# Patient Record
Sex: Male | Born: 1945
Health system: Southern US, Community
[De-identification: ages and names within clinical notes are randomized; demographics above are authoritative.]

## PROBLEM LIST (undated history)

## (undated) DIAGNOSIS — Z87442 Personal history of urinary calculi: Secondary | ICD-10-CM

## (undated) DIAGNOSIS — E785 Hyperlipidemia, unspecified: Secondary | ICD-10-CM

## (undated) DIAGNOSIS — N281 Cyst of kidney, acquired: Secondary | ICD-10-CM

## (undated) DIAGNOSIS — J439 Emphysema, unspecified: Secondary | ICD-10-CM

## (undated) DIAGNOSIS — Z8619 Personal history of other infectious and parasitic diseases: Secondary | ICD-10-CM

## (undated) DIAGNOSIS — M1990 Unspecified osteoarthritis, unspecified site: Secondary | ICD-10-CM

## (undated) DIAGNOSIS — K760 Fatty (change of) liver, not elsewhere classified: Secondary | ICD-10-CM

## (undated) DIAGNOSIS — F17200 Nicotine dependence, unspecified, uncomplicated: Secondary | ICD-10-CM

## (undated) DIAGNOSIS — I10 Essential (primary) hypertension: Secondary | ICD-10-CM

## (undated) DIAGNOSIS — I2510 Atherosclerotic heart disease of native coronary artery without angina pectoris: Secondary | ICD-10-CM

## (undated) DIAGNOSIS — I714 Abdominal aortic aneurysm, without rupture: Principal | ICD-10-CM

## (undated) DIAGNOSIS — K7689 Other specified diseases of liver: Secondary | ICD-10-CM

## (undated) HISTORY — DX: Abdominal aortic aneurysm, without rupture: I71.4

## (undated) HISTORY — DX: Cyst of kidney, acquired: N28.1

## (undated) HISTORY — DX: Personal history of other infectious and parasitic diseases: Z86.19

## (undated) HISTORY — DX: Atherosclerotic heart disease of native coronary artery without angina pectoris: I25.10

## (undated) HISTORY — DX: Emphysema, unspecified: J43.9

## (undated) HISTORY — DX: Other specified diseases of liver: K76.89

## (undated) HISTORY — DX: Unspecified osteoarthritis, unspecified site: M19.90

## (undated) HISTORY — PX: ANKLE SURGERY: SHX546

## (undated) HISTORY — DX: Personal history of urinary calculi: Z87.442

## (undated) HISTORY — DX: Fatty (change of) liver, not elsewhere classified: K76.0

## (undated) HISTORY — DX: Nicotine dependence, unspecified, uncomplicated: F17.200

---

## 1964-08-28 DIAGNOSIS — Z87442 Personal history of urinary calculi: Secondary | ICD-10-CM

## 1964-08-28 HISTORY — DX: Personal history of urinary calculi: Z87.442

## 1998-08-28 HISTORY — PX: CHOLECYSTECTOMY: SHX55

## 1999-04-15 ENCOUNTER — Ambulatory Visit (HOSPITAL_COMMUNITY): Admission: RE | Admit: 1999-04-15 | Discharge: 1999-04-15 | Payer: Self-pay | Admitting: *Deleted

## 2014-08-07 ENCOUNTER — Encounter: Payer: Self-pay | Admitting: Family Medicine

## 2014-08-07 ENCOUNTER — Encounter (INDEPENDENT_AMBULATORY_CARE_PROVIDER_SITE_OTHER): Payer: Self-pay

## 2014-08-07 ENCOUNTER — Ambulatory Visit (INDEPENDENT_AMBULATORY_CARE_PROVIDER_SITE_OTHER): Payer: 59 | Admitting: Family Medicine

## 2014-08-07 VITALS — BP 140/90 | HR 84 | Temp 98.2°F | Ht 68.0 in | Wt 172.2 lb

## 2014-08-07 DIAGNOSIS — F17200 Nicotine dependence, unspecified, uncomplicated: Secondary | ICD-10-CM

## 2014-08-07 DIAGNOSIS — I10 Essential (primary) hypertension: Secondary | ICD-10-CM | POA: Insufficient documentation

## 2014-08-07 DIAGNOSIS — Z720 Tobacco use: Secondary | ICD-10-CM

## 2014-08-07 DIAGNOSIS — R030 Elevated blood-pressure reading, without diagnosis of hypertension: Secondary | ICD-10-CM

## 2014-08-07 DIAGNOSIS — Z87891 Personal history of nicotine dependence: Secondary | ICD-10-CM | POA: Insufficient documentation

## 2014-08-07 DIAGNOSIS — Z23 Encounter for immunization: Secondary | ICD-10-CM

## 2014-08-07 NOTE — Progress Notes (Signed)
Pre visit review using our clinic review tool, if applicable. No additional management support is needed unless otherwise documented below in the visit note. 

## 2014-08-07 NOTE — Addendum Note (Signed)
Addended by: Royann Shivers A on: 08/07/2014 03:24 PM   Modules accepted: Orders

## 2014-08-07 NOTE — Patient Instructions (Addendum)
prevnar today. Call your insurance about the shingles shot to see if it is covered or how much it would cost and where is cheaper (here or pharmacy).  If you want to receive here, call for nurse visit.  Your blood pressure is looking ok today - I think this was the ham. Watch salt and sodium in diet. Increase fruits/vegetables and water.  Keep an eye on blood pressure at home once a week or once every 2 weeks and let me know if persistently >140/90. Return at your convenience over next 3-4 months for wellness visit

## 2014-08-07 NOTE — Progress Notes (Signed)
   BP 140/90 mmHg  Pulse 84  Temp(Src) 98.2 F (36.8 C) (Oral)  Ht 5\' 8"  (1.727 m)  Wt 172 lb 4 oz (78.132 kg)  BMI 26.20 kg/m2   CC: new pt to establish  Subjective:    Patient ID: Gregory Colon, male    DOB: 01-07-1946, 68 y.o.   MRN: [id number]  HPI: Gregory Colon is a 68 y.o. male presenting on 08/07/2014 for Establish Care   No PCP in 15 yrs (no doc in 15 yrs).  Father of Gregory Colon. She was worried about his blood pressure. At home it had been 190/100s. Had increased country ham intake over several days when bp checked. Drinks some water but mainly coffee 4-5 cups/day. Denies palpitations or skipped heart beats. Denies HA, vision changes, CP/tightness, SOB, leg swelling.  Smoker - 1 ppd chronically. Precontemplative.  Preventative: Flu at [facility] today. zostavax - will check with insurance.  Lives alone, divorced, father of Gregory Colon Occupation: retired, was [employer] Activity: yardwork - cutting wood Diet: some water, fruits/vegetables some  Relevant past medical, surgical, family and social history reviewed and updated as indicated. Interim medical history since our last visit reviewed. Allergies and medications reviewed and updated.  No current outpatient prescriptions on file prior to visit.   No current facility-administered medications on file prior to visit.    Review of Systems Per HPI unless specifically indicated above     Objective:    BP 140/90 mmHg  Pulse 84  Temp(Src) 98.2 F (36.8 C) (Oral)  Ht 5\' 8"  (1.727 m)  Wt 172 lb 4 oz (78.132 kg)  BMI 26.20 kg/m2  Wt Readings from Last 3 Encounters:  08/07/14 172 lb 4 oz (78.132 kg)    Physical Exam  Constitutional: He appears well-developed and well-nourished. No distress.  HENT:  Mouth/Throat: Oropharynx is clear and moist. No oropharyngeal exudate.  Eyes: Conjunctivae and EOM are normal. Pupils are equal, round, and reactive to light. No scleral icterus.    Neck: Normal range of motion. Neck supple.  Cardiovascular: Normal rate, regular rhythm, normal heart sounds and intact distal pulses.   No murmur heard. Pulmonary/Chest: Effort normal and breath sounds normal. No respiratory distress. He has no wheezes. He has no rales.  Lungs clear on exam today.  Musculoskeletal: He exhibits no edema.  Skin: Skin is warm and dry. No rash noted.  Psychiatric: He has a normal mood and affect.  Nursing note and vitals reviewed.  No results found for this or any previous visit.    Assessment & Plan:   Problem List Items Addressed This Visit    Smoker    Encourage cessation precontemplative.    Elevated blood pressure reading without diagnosis of hypertension - Primary    Anticipate diet related - discussed this in detail as well as dietary and lifestyle choices to improve readings. Pt reliable, can monitor blood pressures at daughter's house (neighbors) and will notify me if persistently >140/90.        Follow up plan: Return in about 3 months (around 11/06/2014), or as needed, for annual exam, prior fasting for blood work.

## 2014-08-07 NOTE — Assessment & Plan Note (Signed)
Encourage cessation. precontemplative. 

## 2014-08-07 NOTE — Assessment & Plan Note (Signed)
Anticipate diet related - discussed this in detail as well as dietary and lifestyle choices to improve readings. Pt reliable, can monitor blood pressures at daughter's house (neighbors) and will notify me if persistently >140/90.

## 2014-08-10 ENCOUNTER — Telehealth: Payer: Self-pay | Admitting: Family Medicine

## 2014-08-10 NOTE — Telephone Encounter (Signed)
emmi mailed  °

## 2014-08-25 ENCOUNTER — Emergency Department: Payer: Self-pay | Admitting: Emergency Medicine

## 2014-08-25 LAB — COMPREHENSIVE METABOLIC PANEL
ALBUMIN: 3.9 g/dL (ref 3.4–5.0)
ANION GAP: 6 — AB (ref 7–16)
Alkaline Phosphatase: 76 U/L
BUN: 17 mg/dL (ref 7–18)
Bilirubin,Total: 0.2 mg/dL (ref 0.2–1.0)
Calcium, Total: 9.1 mg/dL (ref 8.5–10.1)
Chloride: 105 mmol/L (ref 98–107)
Co2: 25 mmol/L (ref 21–32)
Creatinine: 0.98 mg/dL (ref 0.60–1.30)
Glucose: 99 mg/dL (ref 65–99)
Osmolality: 274 (ref 275–301)
Potassium: 4.3 mmol/L (ref 3.5–5.1)
SGOT(AST): 17 U/L (ref 15–37)
SGPT (ALT): 25 U/L
Sodium: 136 mmol/L (ref 136–145)
Total Protein: 7.4 g/dL (ref 6.4–8.2)

## 2014-08-25 LAB — BASIC METABOLIC PANEL
Creatinine: 1 mg/dL (ref <=1.3)
GLUCOSE: 99 mg/dL
POTASSIUM: 4.3 mmol/L (ref 3.4–5.3)
SODIUM: 136 mmol/L — AB (ref 137–147)

## 2014-08-25 LAB — HEPATIC FUNCTION PANEL
ALT: 25 U/L (ref 10–40)
AST: 17 U/L (ref 14–40)
Alkaline Phosphatase: 76 U/L (ref 25–125)
Bilirubin, Total: 0.2 mg/dL

## 2014-08-25 LAB — CBC WITH DIFFERENTIAL/PLATELET
BASOS ABS: 0.1 10*3/uL (ref 0.0–0.1)
BASOS PCT: 1.1 %
Eosinophil #: 0.3 10*3/uL (ref 0.0–0.7)
Eosinophil %: 2.9 %
HCT: 47.3 % (ref 40.0–52.0)
HGB: 15.6 g/dL (ref 13.0–18.0)
Lymphocyte #: 3.2 10*3/uL (ref 1.0–3.6)
Lymphocyte %: 29.1 %
MCH: 30.3 pg (ref 26.0–34.0)
MCHC: 33 g/dL (ref 32.0–36.0)
MCV: 92 fL (ref 80–100)
MONO ABS: 1 x10 3/mm (ref 0.2–1.0)
Monocyte %: 9.7 %
NEUTROS ABS: 6.2 10*3/uL (ref 1.4–6.5)
Neutrophil %: 57.2 %
PLATELETS: 293 10*3/uL (ref 150–440)
RBC: 5.14 10*6/uL (ref 4.40–5.90)
RDW: 13.8 % (ref 11.5–14.5)
WBC: 10.9 10*3/uL — ABNORMAL HIGH (ref 3.8–10.6)

## 2014-08-25 LAB — TROPONIN I: Troponin-I: <0.02 ng/mL

## 2014-08-25 LAB — URINALYSIS, COMPLETE
BACTERIA: NONE SEEN
Bilirubin,UR: NEGATIVE
Glucose,UR: NEGATIVE mg/dL (ref 0–75)
KETONE: NEGATIVE
LEUKOCYTE ESTERASE: NEGATIVE
Nitrite: NEGATIVE
PH: 5 (ref 4.5–8.0)
Protein: NEGATIVE
RBC,UR: <1 /HPF (ref 0–5)
Specific Gravity: 1.005 (ref 1.003–1.030)
Squamous Epithelial: NONE SEEN

## 2014-08-25 LAB — CBC AND DIFFERENTIAL
Hemoglobin: 15.6 g/dL (ref 13.5–17.5)
PLATELETS: 293 10*3/uL (ref 150–399)
WBC: 10.9 10^3/mL

## 2014-08-26 ENCOUNTER — Telehealth: Payer: Self-pay | Admitting: Family Medicine

## 2014-08-26 MED ORDER — AMLODIPINE BESYLATE 5 MG PO TABS: 5.0000 mg | ORAL_TABLET | Freq: Every day | ORAL | Status: DC

## 2014-08-26 NOTE — Telephone Encounter (Signed)
received message from daughter - bp has been ranging 160/90s. plz call patient - I'd like him to start amlodipine 5mg  daily and have sent to pharmacy. Keep f/u appt in 3 months, sooner if bp remaining consistently >150/90.

## 2014-08-26 NOTE — Telephone Encounter (Signed)
Spoke with patient and he said he actually went to [facility] last night due to BP. They sent in Rx for him but he wasn't at home and couldn't remember the name. He said he is feeling MUCH better. Follow up scheduled for next week.

## 2014-08-29 NOTE — Telephone Encounter (Signed)
Reviewed ER records from [facility] --> Started on hctz, to f/u with me EKG WNL TnI <0.02

## 2014-09-03 ENCOUNTER — Ambulatory Visit (INDEPENDENT_AMBULATORY_CARE_PROVIDER_SITE_OTHER): Payer: Medicare Other | Admitting: Family Medicine

## 2014-09-03 ENCOUNTER — Encounter: Payer: Self-pay | Admitting: Family Medicine

## 2014-09-03 VITALS — BP 138/84 | HR 80 | Temp 97.7°F | Wt 168.2 lb

## 2014-09-03 DIAGNOSIS — I10 Essential (primary) hypertension: Secondary | ICD-10-CM

## 2014-09-03 DIAGNOSIS — Z720 Tobacco use: Secondary | ICD-10-CM

## 2014-09-03 DIAGNOSIS — F17200 Nicotine dependence, unspecified, uncomplicated: Secondary | ICD-10-CM

## 2014-09-03 LAB — BASIC METABOLIC PANEL
BUN: 16 mg/dL (ref 6–23)
CALCIUM: 9.8 mg/dL (ref 8.4–10.5)
CO2: 30 mEq/L (ref 19–32)
CREATININE: 1.2 mg/dL (ref 0.4–1.5)
Chloride: 97 mEq/L (ref 96–112)
GFR: 66.96 mL/min (ref >60.00)
GLUCOSE: 115 mg/dL — AB (ref 70–99)
Potassium: 4 mEq/L (ref 3.5–5.1)
Sodium: 136 mEq/L (ref 135–145)

## 2014-09-03 MED ORDER — HYDROCHLOROTHIAZIDE 25 MG PO TABS: 25.0000 mg | ORAL_TABLET | Freq: Every day | ORAL | Status: DC

## 2014-09-03 NOTE — Assessment & Plan Note (Signed)
Continue to encourage cessation. In action phase - has increased hard candy use.

## 2014-09-03 NOTE — Progress Notes (Signed)
   BP 138/84 mmHg  Pulse 80  Temp(Src) 97.7 F (36.5 C) (Oral)  Wt 168 lb 4 oz (76.318 kg)   CC: ER f/u  Subjective:    Patient ID: Gregory Colon, male    DOB: 01-29-1946, 69 y.o.   MRN: [id number]  HPI: Gregory Colon is a 69 y.o. male presenting on 09/03/2014 for Follow-up   Recent eval at [facility] ER  --> Started on hctz, rec f/u with me EKG WNL TnI <0.02  Increased fruit intake. On hctz 25mg  daily and tolerating well.  BMP today.  Denies HA, vision changes, CP/tightness, SOB, leg swelling.   Smoking - working on cutting down.   Relevant past medical, surgical, family and social history reviewed and updated as indicated. Interim medical history since our last visit reviewed. Allergies and medications reviewed and updated. Current Outpatient Prescriptions on File Prior to Visit  Medication Sig  . Multiple Vitamins-Minerals (MULTIVITAMIN ADULTS 50+ PO) Take 1 tablet by mouth daily.   No current facility-administered medications on file prior to visit.    Review of Systems Per HPI unless specifically indicated above     Objective:    BP 138/84 mmHg  Pulse 80  Temp(Src) 97.7 F (36.5 C) (Oral)  Wt 168 lb 4 oz (76.318 kg)  Wt Readings from Last 3 Encounters:  09/03/14 168 lb 4 oz (76.318 kg)  08/07/14 172 lb 4 oz (78.132 kg)    Physical Exam  Constitutional: He appears well-developed and well-nourished. No distress.  HENT:  Mouth/Throat: Oropharynx is clear and moist. No oropharyngeal exudate.  Eyes: Conjunctivae and EOM are normal. Pupils are equal, round, and reactive to light. No scleral icterus.  Neck: Normal range of motion. Neck supple. No thyromegaly present.  Cardiovascular: Normal rate, regular rhythm, normal heart sounds and intact distal pulses.   No murmur heard. Pulmonary/Chest: Effort normal and breath sounds normal. No respiratory distress. He has no wheezes. He has no rales.  Musculoskeletal: He exhibits no edema.  Lymphadenopathy:    He  has no cervical adenopathy.  Psychiatric: He has a normal mood and affect.  Nursing note and vitals reviewed.  Results for orders placed or performed in visit on 08/26/14  CBC and differential  Result Value Ref Range   Hemoglobin 15.6 13.5 - 17.5 g/dL   Platelets 293 150 - 399 K/L   WBC 10.9 70^9/GG  Basic metabolic panel  Result Value Ref Range   Glucose 99 mg/dL   Creatinine 1.0 .6 - 1.3 mg/dL   Potassium 4.3 3.4 - 5.3 mmol/L   Sodium 136 (A) 137 - 147 mmol/L  Hepatic function panel  Result Value Ref Range   Alkaline Phosphatase 76 25 - 125 U/L   ALT 25 10 - 40 U/L   AST 17 14 - 40 U/L   Bilirubin, Total 0.2 mg/dL      Assessment & Plan:   Problem List Items Addressed This Visit    Smoker    Continue to encourage cessation. In action phase - has increased hard candy use.    Benign essential HTN - Primary    Tolerating hctz well. Recent EKG at ER stable. Check BMP today for K and Cr on HCTZ. Continue med. F/u in 2 mo for CPE/AMW.    Relevant Medications      hydrochlorothiazide tablet   Other Relevant Orders      Basic metabolic panel       Follow up plan: Return as needed.

## 2014-09-03 NOTE — Progress Notes (Signed)
Pre visit review using our clinic review tool, if applicable. No additional management support is needed unless otherwise documented below in the visit note. 

## 2014-09-03 NOTE — Patient Instructions (Addendum)
I'm gland HCTZ (hydrochlorothiazide) is working well. Let's continue this and you don't need to take amlodipine. Blood work today. Continue working towards quitting smoking. Keep march physical.

## 2014-09-03 NOTE — Assessment & Plan Note (Signed)
Tolerating hctz well. Recent EKG at ER stable. Check BMP today for K and Cr on HCTZ. Continue med. F/u in 2 mo for CPE/AMW.

## 2014-09-04 ENCOUNTER — Telehealth: Payer: Self-pay | Admitting: Family Medicine

## 2014-09-04 ENCOUNTER — Encounter: Payer: Self-pay | Admitting: Family Medicine

## 2014-09-04 NOTE — Telephone Encounter (Signed)
emmi mailed  °

## 2014-09-07 ENCOUNTER — Encounter: Payer: Self-pay | Admitting: *Deleted

## 2014-09-24 ENCOUNTER — Telehealth: Payer: Self-pay

## 2014-09-24 ENCOUNTER — Other Ambulatory Visit: Payer: Self-pay | Admitting: *Deleted

## 2014-09-24 MED ORDER — HYDROCHLOROTHIAZIDE 25 MG PO TABS: 25.0000 mg | ORAL_TABLET | Freq: Every day | ORAL | Status: DC

## 2014-09-24 NOTE — Telephone Encounter (Signed)
Cristy pts daughter wanted to ck refill status of HCTZ; pt was told had no refills available. Spoke with Mendel Ryder at [facility] and rx ready for pick up. Cristy voiced understanding.

## 2014-10-27 DIAGNOSIS — J439 Emphysema, unspecified: Secondary | ICD-10-CM

## 2014-10-27 DIAGNOSIS — I2510 Atherosclerotic heart disease of native coronary artery without angina pectoris: Secondary | ICD-10-CM

## 2014-10-27 HISTORY — DX: Atherosclerotic heart disease of native coronary artery without angina pectoris: I25.10

## 2014-10-27 HISTORY — DX: Emphysema, unspecified: J43.9

## 2014-10-31 ENCOUNTER — Other Ambulatory Visit: Payer: Self-pay | Admitting: Family Medicine

## 2014-10-31 DIAGNOSIS — I10 Essential (primary) hypertension: Secondary | ICD-10-CM

## 2014-10-31 DIAGNOSIS — Z125 Encounter for screening for malignant neoplasm of prostate: Secondary | ICD-10-CM

## 2014-11-04 ENCOUNTER — Other Ambulatory Visit (INDEPENDENT_AMBULATORY_CARE_PROVIDER_SITE_OTHER): Payer: Medicare Other

## 2014-11-04 DIAGNOSIS — Z125 Encounter for screening for malignant neoplasm of prostate: Secondary | ICD-10-CM | POA: Diagnosis not present

## 2014-11-04 DIAGNOSIS — I10 Essential (primary) hypertension: Secondary | ICD-10-CM

## 2014-11-04 LAB — BASIC METABOLIC PANEL
BUN: 16 mg/dL (ref 6–23)
CALCIUM: 10 mg/dL (ref 8.4–10.5)
CO2: 32 meq/L (ref 19–32)
CREATININE: 1 mg/dL (ref 0.40–1.50)
Chloride: 102 mEq/L (ref 96–112)
GFR: 78.64 mL/min (ref >60.00)
GLUCOSE: 99 mg/dL (ref 70–99)
POTASSIUM: 4.8 meq/L (ref 3.5–5.1)
SODIUM: 140 meq/L (ref 135–145)

## 2014-11-04 LAB — LIPID PANEL
Cholesterol: 208 mg/dL — ABNORMAL HIGH (ref 0–200)
HDL: 39.9 mg/dL (ref >39.00)
LDL CALC: 144 mg/dL — AB (ref 0–99)
NONHDL: 168.1
TRIGLYCERIDES: 123 mg/dL (ref 0.0–149.0)
Total CHOL/HDL Ratio: 5
VLDL: 24.6 mg/dL (ref 0.0–40.0)

## 2014-11-04 LAB — PSA, MEDICARE: PSA: 1.02 ng/mL (ref 0.10–4.00)

## 2014-11-11 ENCOUNTER — Encounter: Payer: Self-pay | Admitting: Family Medicine

## 2014-11-11 ENCOUNTER — Ambulatory Visit (INDEPENDENT_AMBULATORY_CARE_PROVIDER_SITE_OTHER): Payer: Medicare Other | Admitting: Family Medicine

## 2014-11-11 VITALS — BP 138/88 | HR 72 | Temp 97.3°F | Ht 68.0 in | Wt 172.5 lb

## 2014-11-11 DIAGNOSIS — Z0000 Encounter for general adult medical examination without abnormal findings: Secondary | ICD-10-CM | POA: Diagnosis not present

## 2014-11-11 DIAGNOSIS — Z1211 Encounter for screening for malignant neoplasm of colon: Secondary | ICD-10-CM

## 2014-11-11 DIAGNOSIS — F17200 Nicotine dependence, unspecified, uncomplicated: Secondary | ICD-10-CM

## 2014-11-11 DIAGNOSIS — Z7189 Other specified counseling: Secondary | ICD-10-CM

## 2014-11-11 DIAGNOSIS — E785 Hyperlipidemia, unspecified: Secondary | ICD-10-CM

## 2014-11-11 DIAGNOSIS — I10 Essential (primary) hypertension: Secondary | ICD-10-CM

## 2014-11-11 NOTE — Assessment & Plan Note (Signed)
Advanced directive - would want daughter Cristy to be HCPOA. Doesn't want prolonged life support. Packet provided today.

## 2014-11-11 NOTE — Patient Instructions (Addendum)
Pass by lab for stool kit. Advanced directive packet provided today. We will set up lung cancer screening CT scan. Good to see you, continue working towards quitting smoking. Low cholesterol diet handout provided today Return as needed or in 1 year for next wellness visit.

## 2014-11-11 NOTE — Assessment & Plan Note (Signed)
Preventative protocols reviewed and updated unless pt declined. Discussed healthy diet and lifestyle.  

## 2014-11-11 NOTE — Addendum Note (Signed)
Addended by: [name] on: 11/11/2014 10:31 AM   Modules accepted: Orders, SmartSet

## 2014-11-11 NOTE — Progress Notes (Signed)
Pre visit review using our clinic review tool, if applicable. No additional management support is needed unless otherwise documented below in the visit note. 

## 2014-11-11 NOTE — Assessment & Plan Note (Addendum)
Continue to encourage cessation. Contemplative. Discussed benefits vs risks of lung cancer screening with low dose CT scan benefits including but not limited to finding lung cancer that may be amenable to treatment, risks including but not limited to low dose radiation, false positives, need for continued imaging for f/u. Pt interested in lung cancer screen - will order LRCT scan.

## 2014-11-11 NOTE — Progress Notes (Signed)
BP 138/88 mmHg  Pulse 72  Temp(Src) 97.3 F (36.3 C) (Oral)  Ht 5' 8" (1.727 m)  Wt 172 lb 8 oz (78.245 kg)  BMI 26.23 kg/m2   CC: medicare wellness  Subjective:    Patient ID: Gregory Colon, male    DOB: [birth year], 69 y.o.   MRN: [id number]  HPI: Gregory Colon is a 69 y.o. male presenting on 11/11/2014 for Annual Exam   Feels allergies coming on. Started taking allergy pill which is helpful.   Compliant with antihypertensive.  Smoking down to <1/2 ppd. motivated to quit.   Passes hearing screen.  Recent vision screen 11/2013. Denies falls in last year Denies depression/anhedonia, sadness.  Preventative: Colon cancer screening - discussed, requests stool kit Prostate cancer screening - no fmhx. Discussed, would like to defer screening. Lung cancer screening - discussed pros/cons of screening with low dose CT scan. Pt interested. [facility]ostavax - will check with insurance. Advanced directive - would want daughter Cristy to be HCPOA. Doesn't want prolonged life support. Packet provided today.  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 Interim medical history since our last visit reviewed. Allergies and medications reviewed and updated. Current Outpatient Prescriptions on File Prior to Visit  Medication Sig  . hydrochlorothiazide (HYDRODIURIL) 25 MG tablet Take 1 tablet (25 mg total) by mouth daily.  . Multiple Vitamins-Minerals (MULTIVITAMIN ADULTS 50+ PO) Take 1 tablet by mouth daily.   No current facility-administered medications on file prior to visit.    Review of Systems  Constitutional: Negative for fever, chills, activity change, appetite change, fatigue and unexpected weight change.  HENT: Positive for  congestion and rhinorrhea. Negative for hearing loss.   Eyes: Negative for visual disturbance.  Respiratory: Negative for cough, chest tightness, shortness of breath and wheezing.   Cardiovascular: Negative for chest pain, palpitations and leg swelling.  Gastrointestinal: Negative for nausea, vomiting, abdominal pain, diarrhea, constipation, blood in stool and abdominal distention.  Genitourinary: Negative for hematuria and difficulty urinating.  Musculoskeletal: Negative for myalgias, arthralgias and neck pain.  Skin: Negative for rash.  Neurological: Negative for dizziness, seizures, syncope and headaches.  Hematological: Negative for adenopathy. Does not bruise/bleed easily.  Psychiatric/Behavioral: Negative for dysphoric mood. The patient is not nervous/anxious.    Per HPI unless specifically indicated above     Objective:    BP 138/88 mmHg  Pulse 72  Temp(Src) 97.3 F (36.3 C) (Oral)  Ht 5' 8" (1.727 m)  Wt 172 lb 8 oz (78.245 kg)  BMI 26.23 kg/m2  Wt Readings from Last 3 Encounters:  11/11/14 172 lb 8 oz (78.245 kg)  09/03/14 168 lb 4 oz (76.318 kg)  08/07/14 172 lb 4 oz (78.132 kg)    Physical Exam  Constitutional: He is oriented to person, place, and time. He appears well-developed and well-nourished. No distress.  HENT:  Head: Normocephalic and atraumatic.  Right Ear: Hearing, tympanic membrane, external ear and ear canal normal.  Left Ear: Hearing, tympanic membrane, external ear and ear canal normal.  Nose: Nose normal.  Mouth/Throat: Uvula is midline, oropharynx is clear and moist and mucous membranes are normal. No oropharyngeal exudate, posterior oropharyngeal edema or posterior oropharyngeal erythema.  Eyes: Conjunctivae and EOM are normal. Pupils are equal, round, and reactive to light. No scleral icterus.  Neck: Normal range of motion. Neck supple.  Cardiovascular: Normal rate, regular rhythm, normal heart sounds and intact distal pulses.   No murmur  heard. Pulses:      Radial pulses are 2+ on the right side, and 2+ on the left side.  Pulmonary/Chest: Effort normal and breath sounds normal. No respiratory distress. He has no wheezes. He has no rales.  Abdominal: Soft. Bowel sounds are normal. He exhibits no distension and no mass. There is no tenderness. There is no rebound and no guarding.  Musculoskeletal: Normal range of motion. He exhibits no edema.  Lymphadenopathy:    He has no cervical adenopathy.  Neurological: He is alert and oriented to person, place, and time.  CN grossly intact, station and gait intact Recall 3/3 Calculation 5/5 serial 3s  Skin: Skin is warm and dry. No rash noted.  Psychiatric: He has a normal mood and affect. His behavior is normal. Judgment and thought content normal.  Nursing note and vitals reviewed.  Results for orders placed or performed in visit on 11/04/14  Lipid panel  Result Value Ref Range   Cholesterol 208 (H) 0 - 200 mg/dL   Triglycerides 123.0 0.0 - 149.0 mg/dL   HDL 39.90 >39.00 mg/dL   VLDL 24.6 0.0 - 40.0 mg/dL   LDL Cholesterol 144 (H) 0 - 99 mg/dL   Total CHOL/HDL Ratio 5    NonHDL 093.26   Basic metabolic panel  Result Value Ref Range   Sodium 140 135 - 145 mEq/L   Potassium 4.8 3.5 - 5.1 mEq/L   Chloride 102 96 - 112 mEq/L   CO2 32 19 - 32 mEq/L   Glucose, Bld 99 70 - 99 mg/dL   BUN 16 6 - 23 mg/dL   Creatinine, Ser 1.00 0.40 - 1.50 mg/dL   Calcium 10.0 8.4 - 10.5 mg/dL   GFR 78.64 >60.00 mL/min  PSA, Medicare  Result Value Ref Range   PSA 1.02 0.10 - 4.00 ng/ml      Assessment & Plan:   Problem List Items Addressed This Visit    Smoker    Continue to encourage cessation. Contemplative. Discussed benefits vs risks of lung cancer screening with low dose CT scan benefits including but not limited to finding lung cancer that may be amenable to treatment, risks including but not limited to low dose radiation, false positives, need for continued imaging for f/u. Pt  interested in lung cancer screen - will order LRCT scan.      Relevant Orders   CT CHEST LUNG CA SCREEN LOW DOSE W/O CM   Medicare annual wellness visit, initial - Primary    I have personally reviewed the Medicare Annual Wellness questionnaire and have noted 1. The patient's medical and social history 2. Their use of alcohol, tobacco or illicit drugs 3. Their current medications and supplements 4. The patient's functional ability including ADL's, fall risks, home safety risks and hearing or visual impairment. 5. Diet and physical activity 6. Evidence for depression or mood disorders The patients weight, height, BMI have been recorded in the chart.  Hearing and vision has been addressed. I have made referrals, counseling and provided education to the patient based review of the above and I have provided the pt with a written personalized care plan for preventive services. Provider list updated - see scanned questionairre. Reviewed preventative protocols and updated unless pt declined.       HLD (hyperlipidemia)    Mild - provided with low chol diet handout.  Health maintenance examination    Preventative protocols reviewed and updated unless pt declined. Discussed healthy diet and lifestyle.       Benign essential HTN    Chronic, stable. Continue regimen.      Advanced care planning/counseling discussion    Advanced directive - would want daughter Cristy to be HCPOA. Doesn't want prolonged life support. Packet provided today.       Other Visit Diagnoses    Special screening for malignant neoplasms, colon        Relevant Orders    Fecal occult blood, imunochemical        Follow up plan: Return in about 1 year (around 11/11/2015), or as needed, for medicare wellness visit.

## 2014-11-11 NOTE — Assessment & Plan Note (Signed)

## 2014-11-11 NOTE — Assessment & Plan Note (Signed)
Chronic, stable. Continue regimen. 

## 2014-11-11 NOTE — Assessment & Plan Note (Signed)
Mild - provided with low chol diet handout.

## 2014-11-16 ENCOUNTER — Other Ambulatory Visit: Payer: Self-pay | Admitting: Family Medicine

## 2014-11-16 ENCOUNTER — Other Ambulatory Visit: Payer: Medicare Other

## 2014-11-16 DIAGNOSIS — Z1211 Encounter for screening for malignant neoplasm of colon: Secondary | ICD-10-CM

## 2014-11-16 LAB — FECAL OCCULT BLOOD, IMMUNOCHEMICAL: FECAL OCCULT BLD: POSITIVE — AB

## 2014-11-19 ENCOUNTER — Ambulatory Visit (INDEPENDENT_AMBULATORY_CARE_PROVIDER_SITE_OTHER)
Admission: RE | Admit: 2014-11-19 | Discharge: 2014-11-19 | Disposition: A | Discharge status: Home / Self Care | Payer: Medicare Other | Source: Ambulatory Visit | Attending: Family Medicine | Admitting: Family Medicine

## 2014-11-19 DIAGNOSIS — F17210 Nicotine dependence, cigarettes, uncomplicated: Secondary | ICD-10-CM | POA: Diagnosis not present

## 2014-11-19 DIAGNOSIS — F17200 Nicotine dependence, unspecified, uncomplicated: Secondary | ICD-10-CM

## 2014-11-19 DIAGNOSIS — Z122 Encounter for screening for malignant neoplasm of respiratory organs: Secondary | ICD-10-CM

## 2014-11-25 ENCOUNTER — Encounter: Payer: Self-pay | Admitting: Family Medicine

## 2014-11-25 ENCOUNTER — Other Ambulatory Visit: Payer: Self-pay | Admitting: Family Medicine

## 2014-11-25 DIAGNOSIS — J432 Centrilobular emphysema: Secondary | ICD-10-CM

## 2014-11-25 DIAGNOSIS — I2510 Atherosclerotic heart disease of native coronary artery without angina pectoris: Secondary | ICD-10-CM

## 2014-11-25 DIAGNOSIS — N289 Disorder of kidney and ureter, unspecified: Secondary | ICD-10-CM

## 2014-11-25 DIAGNOSIS — K769 Liver disease, unspecified: Secondary | ICD-10-CM

## 2014-11-27 DIAGNOSIS — I714 Abdominal aortic aneurysm, without rupture, unspecified: Secondary | ICD-10-CM | POA: Insufficient documentation

## 2014-11-27 DIAGNOSIS — N281 Cyst of kidney, acquired: Secondary | ICD-10-CM | POA: Insufficient documentation

## 2014-11-27 DIAGNOSIS — K760 Fatty (change of) liver, not elsewhere classified: Secondary | ICD-10-CM | POA: Insufficient documentation

## 2014-11-27 HISTORY — DX: Abdominal aortic aneurysm, without rupture, unspecified: I71.40

## 2014-11-27 HISTORY — DX: Cyst of kidney, acquired: N28.1

## 2014-11-27 HISTORY — DX: Abdominal aortic aneurysm, without rupture: I71.4

## 2014-11-27 HISTORY — DX: Fatty (change of) liver, not elsewhere classified: K76.0

## 2014-12-03 ENCOUNTER — Ambulatory Visit
Admit: 2014-12-03 | Disposition: A | Discharge status: Home / Self Care | Payer: Self-pay | Attending: Family Medicine | Admitting: Family Medicine

## 2014-12-07 ENCOUNTER — Encounter: Payer: Self-pay | Admitting: Family Medicine

## 2014-12-07 ENCOUNTER — Telehealth: Payer: Self-pay | Admitting: Family Medicine

## 2014-12-07 DIAGNOSIS — I77811 Abdominal aortic ectasia: Secondary | ICD-10-CM

## 2014-12-07 DIAGNOSIS — K760 Fatty (change of) liver, not elsewhere classified: Secondary | ICD-10-CM

## 2014-12-07 DIAGNOSIS — N281 Cyst of kidney, acquired: Secondary | ICD-10-CM

## 2014-12-07 NOTE — Telephone Encounter (Signed)
Received abdominal ultrasound from [facility]. plz notify -  liver - multiple cysts (benign), fatty liver Pancreas - visualized portion normal Spleen - normal Kidney - right side with 2 complex cysts and 1 simple cyst, left side normal Abdominal aorta - possibly at risk for developing aneurysm in future - recommend repeat ultrasound in 5 years and quitting smoking.  Overall ok, but as kidney cysts are complex we recommend further evaluation as these can develop into cancer or bleed. Would offer MRI vs referral to urologist whichever patient prefers.

## 2014-12-09 NOTE — Telephone Encounter (Signed)
Patient notified and wants to go ahead with the MRI. Will await call for referral.

## 2014-12-09 NOTE — Telephone Encounter (Signed)
MRI ordered

## 2014-12-18 ENCOUNTER — Encounter: Payer: Self-pay | Admitting: Family Medicine

## 2014-12-25 ENCOUNTER — Ambulatory Visit
Admit: 2014-12-25 | Disposition: A | Discharge status: Home / Self Care | Payer: Self-pay | Attending: Family Medicine | Admitting: Family Medicine

## 2015-01-08 ENCOUNTER — Encounter: Payer: Self-pay | Admitting: *Deleted

## 2015-01-08 ENCOUNTER — Encounter: Payer: Self-pay | Admitting: Family Medicine

## 2015-01-08 ENCOUNTER — Ambulatory Visit: Payer: Medicare Other | Admitting: Anesthesiology

## 2015-01-08 ENCOUNTER — Ambulatory Visit
Admission: RE | Admit: 2015-01-08 | Discharge: 2015-01-08 | Disposition: A | Discharge status: Home / Self Care | Payer: Medicare Other | Source: Ambulatory Visit | Attending: Unknown Physician Specialty | Admitting: Unknown Physician Specialty

## 2015-01-08 ENCOUNTER — Encounter
Admission: RE | Disposition: A | Discharge status: Home / Self Care | Payer: Self-pay | Source: Ambulatory Visit | Attending: Unknown Physician Specialty

## 2015-01-08 DIAGNOSIS — K2970 Gastritis, unspecified, without bleeding: Secondary | ICD-10-CM | POA: Insufficient documentation

## 2015-01-08 DIAGNOSIS — F17200 Nicotine dependence, unspecified, uncomplicated: Secondary | ICD-10-CM | POA: Diagnosis not present

## 2015-01-08 DIAGNOSIS — Z79899 Other long term (current) drug therapy: Secondary | ICD-10-CM | POA: Diagnosis not present

## 2015-01-08 DIAGNOSIS — I2510 Atherosclerotic heart disease of native coronary artery without angina pectoris: Secondary | ICD-10-CM | POA: Diagnosis not present

## 2015-01-08 DIAGNOSIS — D125 Benign neoplasm of sigmoid colon: Secondary | ICD-10-CM | POA: Diagnosis not present

## 2015-01-08 DIAGNOSIS — I10 Essential (primary) hypertension: Secondary | ICD-10-CM | POA: Insufficient documentation

## 2015-01-08 DIAGNOSIS — K621 Rectal polyp: Secondary | ICD-10-CM | POA: Diagnosis not present

## 2015-01-08 DIAGNOSIS — D128 Benign neoplasm of rectum: Secondary | ICD-10-CM | POA: Diagnosis not present

## 2015-01-08 DIAGNOSIS — I714 Abdominal aortic aneurysm, without rupture, unspecified: Secondary | ICD-10-CM

## 2015-01-08 DIAGNOSIS — N281 Cyst of kidney, acquired: Secondary | ICD-10-CM | POA: Insufficient documentation

## 2015-01-08 DIAGNOSIS — Z9049 Acquired absence of other specified parts of digestive tract: Secondary | ICD-10-CM | POA: Insufficient documentation

## 2015-01-08 DIAGNOSIS — D122 Benign neoplasm of ascending colon: Secondary | ICD-10-CM | POA: Insufficient documentation

## 2015-01-08 DIAGNOSIS — I77819 Aortic ectasia, unspecified site: Secondary | ICD-10-CM | POA: Insufficient documentation

## 2015-01-08 DIAGNOSIS — K640 First degree hemorrhoids: Secondary | ICD-10-CM | POA: Diagnosis not present

## 2015-01-08 DIAGNOSIS — K635 Polyp of colon: Secondary | ICD-10-CM | POA: Diagnosis not present

## 2015-01-08 DIAGNOSIS — K3189 Other diseases of stomach and duodenum: Secondary | ICD-10-CM | POA: Insufficient documentation

## 2015-01-08 DIAGNOSIS — K760 Fatty (change of) liver, not elsewhere classified: Secondary | ICD-10-CM

## 2015-01-08 DIAGNOSIS — J439 Emphysema, unspecified: Secondary | ICD-10-CM | POA: Diagnosis not present

## 2015-01-08 DIAGNOSIS — D123 Benign neoplasm of transverse colon: Secondary | ICD-10-CM | POA: Diagnosis not present

## 2015-01-08 DIAGNOSIS — J302 Other seasonal allergic rhinitis: Secondary | ICD-10-CM | POA: Diagnosis not present

## 2015-01-08 DIAGNOSIS — E784 Other hyperlipidemia: Secondary | ICD-10-CM | POA: Diagnosis not present

## 2015-01-08 DIAGNOSIS — K2960 Other gastritis without bleeding: Secondary | ICD-10-CM | POA: Diagnosis not present

## 2015-01-08 DIAGNOSIS — R195 Other fecal abnormalities: Secondary | ICD-10-CM | POA: Diagnosis present

## 2015-01-08 HISTORY — PX: ESOPHAGOGASTRODUODENOSCOPY: SHX5428

## 2015-01-08 HISTORY — PX: COLONOSCOPY: SHX5424

## 2015-01-08 SURGERY — COLONOSCOPY
Anesthesia: General

## 2015-01-08 MED ORDER — FENTANYL CITRATE (PF) 100 MCG/2ML IJ SOLN
INTRAMUSCULAR | Status: DC | PRN
  Administered 2015-01-08: 50 ug via INTRAVENOUS

## 2015-01-08 MED ORDER — PROPOFOL 10 MG/ML IV BOLUS
INTRAVENOUS | Status: DC | PRN
  Administered 2015-01-08: 30 mg via INTRAVENOUS
  Administered 2015-01-08: 40 mg via INTRAVENOUS

## 2015-01-08 MED ORDER — PROPOFOL INFUSION 10 MG/ML OPTIME
INTRAVENOUS | Status: DC | PRN
  Administered 2015-01-08: 100 ug/kg/min via INTRAVENOUS

## 2015-01-08 MED ORDER — MIDAZOLAM HCL 2 MG/2ML IJ SOLN
INTRAMUSCULAR | Status: DC | PRN
  Administered 2015-01-08: 2 mg via INTRAVENOUS

## 2015-01-08 MED ORDER — FENTANYL CITRATE (PF) 100 MCG/2ML IJ SOLN: 25.0000 ug | INTRAMUSCULAR | Status: DC | PRN

## 2015-01-08 MED ORDER — LACTATED RINGERS IV SOLN
INTRAVENOUS | Status: DC
  Administered 2015-01-08: 1000 mL via INTRAVENOUS
  Administered 2015-01-08: 11:00:00 via INTRAVENOUS

## 2015-01-08 MED ORDER — SODIUM CHLORIDE 0.9 % IV SOLN: INTRAVENOUS | Status: DC

## 2015-01-08 MED ORDER — PHENYLEPHRINE HCL 10 MG/ML IJ SOLN
INTRAMUSCULAR | Status: DC | PRN
  Administered 2015-01-08 (×2): 100 ug via INTRAVENOUS

## 2015-01-08 MED ORDER — SODIUM CHLORIDE 0.9 % IJ SOLN
INTRAMUSCULAR | Status: DC | PRN
  Administered 2015-01-08: 10 mL via INTRAVENOUS

## 2015-01-08 MED ORDER — EPHEDRINE SULFATE 50 MG/ML IJ SOLN
INTRAMUSCULAR | Status: DC | PRN
  Administered 2015-01-08 (×2): 10 mg via INTRAVENOUS

## 2015-01-08 MED ORDER — LIDOCAINE HCL (CARDIAC) 10 MG/ML IV SOLN
INTRAVENOUS | Status: DC | PRN
  Administered 2015-01-08: 100 mg via INTRAVENOUS

## 2015-01-08 MED ORDER — ONDANSETRON HCL 4 MG/2ML IJ SOLN: 4.0000 mg | Freq: Once | INTRAMUSCULAR | Status: DC | PRN

## 2015-01-08 NOTE — Op Note (Signed)
[facility] Patient Name: Gregory Colon Procedure Date: 01/08/2015 10:58 AM MRN: [id number] Account #: [id number] Date of Birth: July 17, 1946 Admit Type: Outpatient Age: 69 Room: [facility] ENDO ROOM 1 Gender: Male Note Status: Finalized Procedure:         Colonoscopy Indications:       Heme positive stool Providers:         Manya Silvas, MD Referring MD:      [name] (Referring MD) Medicines:         Propofol per Anesthesia Complications:     No immediate complications. Procedure:         Pre-Anesthesia Assessment:                    - After reviewing the risks and benefits, the patient was                     deemed in satisfactory condition to undergo the procedure.                    After obtaining informed consent, the colonoscope was                     passed under direct vision. Throughout the procedure, the                     patient's blood pressure, pulse, and oxygen saturations                     were monitored continuously. The Colonoscope was                     introduced through the anus and advanced to the the cecum,                     identified by appendiceal orifice and ileocecal valve. The                     colonoscopy was performed without difficulty. The patient                     tolerated the procedure well. The quality of the bowel                     preparation was good. Findings:      Four sessile polyps were found in the rectum, in the sigmoid colon, at       the hepatic flexure and in the ascending colon. The polyps were small-       large in size. These polyps were removed with a hot snare. Resection and       retrieval were complete.      Internal hemorrhoids were found during endoscopy. The hemorrhoids were       small and Grade I (internal hemorrhoids that do not prolapse). One in       the proximal ascending colon was long and thin and injected with saline       and removed piecemeal and the  linear divot closed with 3 cllips. Before       applying the clips the edges were treated with argon at setting of right       colon intensity. Impression:        - Four large polyps in the rectum, in the sigmoid colon,  at the hepatic flexure and in the ascending colon.                     Resected and retrieved.                    - Internal hemorrhoids. Recommendation:    - Await pathology results. Manya Silvas, MD 01/08/2015 12:13:35 PM This report has been signed electronically. Number of Addenda: 0 Note Initiated On: 01/08/2015 10:58 AM Scope Withdrawal Time: 0 hours 39 minutes 7 seconds  Total Procedure Duration: 0 hours 46 minutes 34 seconds       [facility]

## 2015-01-08 NOTE — Anesthesia Preprocedure Evaluation (Addendum)
Anesthesia Evaluation  Patient identified by MRN, date of birth, ID band Patient awake    Reviewed: Allergy & Precautions, NPO status , Patient's Chart, lab work & pertinent test results  Airway Mallampati: II  TM Distance: >3 FB Neck ROM: Full    Dental  (+) Lower Dentures, Upper Dentures   Pulmonary COPDCurrent Smoker,  Sinus issues.    Pt states he is not on inhalers breath sounds clear to auscultation  Pulmonary exam normal       Cardiovascular hypertension, Pt. on medications + CAD and + Peripheral Vascular Disease Rhythm:Regular Rate:Normal     Neuro/Psych negative neurological ROS  negative psych ROS   GI/Hepatic negative GI ROS, Neg liver ROS,   Endo/Other  negative endocrine ROS  Renal/GU Renal diseaseComplex cyst of kidney  negative genitourinary   Musculoskeletal  (+) Arthritis -, Osteoarthritis,    Abdominal Normal abdominal exam  (+)   Peds negative pediatric ROS (+)  Hematology negative hematology ROS (+)   Anesthesia Other Findings   Reproductive/Obstetrics negative OB ROS                            Anesthesia Physical Anesthesia Plan  ASA: III  Anesthesia Plan: General   Post-op Pain Management:    Induction: Intravenous  Airway Management Planned: Nasal Cannula  Additional Equipment:   Intra-op Plan:   Post-operative Plan:   Informed Consent: I have reviewed the patients History and Physical, chart, labs and discussed the procedure including the risks, benefits and alternatives for the proposed anesthesia with the patient or authorized representative who has indicated his/her understanding and acceptance.   Dental advisory given  Plan Discussed with: Surgeon and CRNA  Anesthesia Plan Comments:         Anesthesia Quick Evaluation

## 2015-01-08 NOTE — H&P (Signed)
The recent H&P (dated 12/18/2014) was reviewed, the patient was examined and there is no change in the patients condition since that H&P was completed.   Gaylyn Cheers, MD  01/08/2015, 10:54 AM

## 2015-01-08 NOTE — Anesthesia Procedure Notes (Signed)
Procedure Name: MAC Performed by: Kaelyn Nauta, Joelene Millin Pre-anesthesia Checklist: Patient identified, Emergency Drugs available, Suction available and Patient being monitored Oxygen Delivery Method: Nasal cannula

## 2015-01-08 NOTE — Anesthesia Postprocedure Evaluation (Signed)
  Anesthesia Post-op Note  Patient: Gregory Colon  Procedure(s) Performed: Procedure(s): COLONOSCOPY (N/A) ESOPHAGOGASTRODUODENOSCOPY (EGD) (N/A)  Anesthesia type:General  Patient location: PACU  Post pain: Pain level controlled  Post assessment: Post-op Vital signs reviewed, Patient's Cardiovascular Status Stable, Respiratory Function Stable, Patent Airway and No signs of Nausea or vomiting  Post vital signs: Reviewed and stable  Last Vitals:  Filed Vitals:   01/08/15 1250  BP:   Pulse:   Temp:   Resp: 24    Level of consciousness: awake, alert  and patient cooperative  Complications: No apparent anesthesia complications

## 2015-01-08 NOTE — Transfer of Care (Signed)
Immediate Anesthesia Transfer of Care Note  Patient: Gregory Colon  Procedure(s) Performed: Procedure(s): COLONOSCOPY (N/A) ESOPHAGOGASTRODUODENOSCOPY (EGD) (N/A)  Patient Location: PACU  Anesthesia Type:General  Level of Consciousness: awake, alert  and patient cooperative  Airway & Oxygen Therapy: Patient Spontanous Breathing and Patient connected to nasal cannula oxygen  Post-op Assessment: Report given to RN  Post vital signs: Reviewed and stable  Last Vitals:  Filed Vitals:   01/08/15 0947  BP: 131/86  Pulse: 82  Temp: 36.2 C  Resp: 18    Complications: No apparent anesthesia complications

## 2015-01-08 NOTE — Op Note (Addendum)
[facility] Gastroenterology Patient Name: Gregory Colon Procedure Date: 01/08/2015 10:57 AM MRN: [id number] Account #: [id number] Date of Birth: [birth year] Admit Type: Outpatient Age: 69 Room: [facility] ENDO ROOM 1 Gender: Male Note Status: Supervisor Override Procedure:         Upper GI endoscopy Indications:       Heme positive stool Providers:         Manya Silvas, MD Referring MD:      [name] (Referring MD) Medicines:         Propofol per Anesthesia Complications:     No immediate complications. Procedure:         Pre-Anesthesia Assessment:                    - After reviewing the risks and benefits, the patient was                     deemed in satisfactory condition to undergo the procedure.                    After obtaining informed consent, the endoscope was passed                     under direct vision. Throughout the procedure, the                     patient's blood pressure, pulse, and oxygen saturations                     were monitored continuously. The Endoscope was introduced                     through the mouth, and advanced to the second part of                     duodenum. The upper GI endoscopy was accomplished without                     difficulty. The patient tolerated the procedure well. Findings:      The Z-line was irregular and was found 40 cm from the incisors.      Diffuse moderately congested mucosa was found in the gastric body.       Cobblestone type appearance.      Diffuse mild inflammation characterized by erythema and granularity was       found in the gastric antrum. Multiple superficial irregular divots seen.      The examined duodenum was normal. Impression:        - Z-line irregular, 40 cm from the incisors.                    - Congestive gastropathy.                    - Gastritis.                    - Normal examined duodenum.                    - No specimens collected. Recommendation:    - Perform  a colonoscopy as previously scheduled. Manya Silvas, MD 01/08/2015 11:16:06 AM This report has been signed electronically. Number of Addenda: 0 Note Initiated On: 01/08/2015 10:57 AM      [facility]

## 2015-01-09 ENCOUNTER — Encounter: Payer: Self-pay | Admitting: Family Medicine

## 2015-01-09 NOTE — Telephone Encounter (Signed)
Gregory Colon this is third imaging study that never returned to me in the past month. Can we do a SZP on this as well? I'm also sending a message to Dr Asa Lente to see who else I should contact about this.

## 2015-01-11 ENCOUNTER — Encounter: Payer: Self-pay | Admitting: Unknown Physician Specialty

## 2015-01-11 LAB — SURGICAL PATHOLOGY

## 2015-01-16 ENCOUNTER — Encounter: Payer: Self-pay | Admitting: Family Medicine

## 2015-02-10 NOTE — Addendum Note (Signed)
Addendum  created 02/10/15 1502 by Alvin Critchley, MD   Modules edited: Anesthesia Attestations

## 2015-10-06 ENCOUNTER — Telehealth: Payer: Self-pay | Admitting: Family Medicine

## 2015-10-06 NOTE — Telephone Encounter (Signed)
Called pts home # and spoke with his brother; pt is not available now and will have pt cb for appt. pts brother will let pt know if needs to be seen before 10/07/15 then pt can go to UC or ED. pts brother voiced understanding.

## 2015-10-06 NOTE — Telephone Encounter (Signed)
Patient Name: Gregory Colon  DOB: [birth year]    Initial Comment Caller states father has had fever since yesterday, body chills.   Nurse Assessment  Nurse: Thad Ranger RN, Denise Date/Time (Eastern Time): 10/06/2015 3:34:57 PM  Confirm and document reason for call. If symptomatic, describe symptoms. You must click the next button to save text entered. ---Caller states father has had fever since yesterday, body chills. States she is at work and the pt is at home sleeping. States he does not want to speak to the triage nurse at this time and just wants to sleep as he does not feel well.  Has the patient traveled out of the country within the last 30 days? ---Not Applicable  Does the patient have any new or worsening symptoms? ---Yes  Will a triage be completed? ---No  Select reason for no triage. ---Other  Please document clinical information provided and list any resource used. ---Pt unavailable for assess/triage at this time. Advised caller to have the pt call back when he is able to for adeq assess/triage. She states he will not call back because he is sick. Advised when she is with the pt, if he will allow RN to assess/triage, I will be able to advise what he needs to do at that time. Caller again refused and states she will call and make the pt a appt.     Guidelines    Guideline Title Affirmed Question Affirmed Notes       Final Disposition User

## 2015-10-07 NOTE — Telephone Encounter (Signed)
plz call patient for an update on how he's feeling - yesterday with fever/chills/body aches.

## 2015-10-07 NOTE — Telephone Encounter (Signed)
Pt said he feels a whole lot better, pt has no fever, chills, or body aches, pt said there is no sxs at all. Pt advise if sxs return or if he develops any new sxs to let us know, pt verbalized understanding

## 2015-10-15 ENCOUNTER — Encounter: Payer: Self-pay | Admitting: Primary Care

## 2015-10-15 ENCOUNTER — Ambulatory Visit (INDEPENDENT_AMBULATORY_CARE_PROVIDER_SITE_OTHER): Payer: Medicare Other | Admitting: Primary Care

## 2015-10-15 VITALS — BP 136/86 | HR 108 | Temp 97.9°F | Ht 68.0 in | Wt 167.8 lb

## 2015-10-15 DIAGNOSIS — H6691 Otitis media, unspecified, right ear: Secondary | ICD-10-CM

## 2015-10-15 MED ORDER — AMOXICILLIN 500 MG PO CAPS
500.0000 mg | ORAL_CAPSULE | Freq: Two times a day (BID) | ORAL | Status: DC
Start: 2015-10-15 — End: 2015-11-22

## 2015-10-15 NOTE — Progress Notes (Signed)
Pre visit review using our clinic review tool, if applicable. No additional management support is needed unless otherwise documented below in the visit note. 

## 2015-10-15 NOTE — Patient Instructions (Signed)
Start amoxicillin antibiotics for ear infection. Take 1 tablet by mouth twice daily for 10 days.  You may take tylenol as needed for pain. Do not exceed 3000 mg of tylenol in 24 hours.  Please call me if you develop fevers over 101, your pain gets worse, you're unable to hear.  It was a pleasure meeting you!

## 2015-10-15 NOTE — Progress Notes (Signed)
Subjective:    Patient ID: Gregory Colon, male    DOB: 1945/10/06, 70 y.o.   MRN: [id number]  HPI  Gregory Colon is a 70 year old male who presents today with a chief complaint of ear fullness and pain. His symptoms are located to the right ear and have been present since Wednesday morning upon waking. He describes his pain as an ache. He's taken tylenol without improvement. Overall he's feeling about the same.  Review of Systems  Constitutional: Positive for chills. Negative for fever and fatigue.  HENT: Positive for congestion and ear pain. Negative for sinus pressure and sore throat.   Respiratory: Negative for cough.        Past Medical History  Diagnosis Date  . Arthritis     R hand  . History of chicken pox   . History of kidney stones 1966  . Smoker   . Liver cyst     multiple  . Kidney cysts 11/2014    R>L  . CAD (coronary artery disease) 10/2014    by CT  . Emphysema of lung ([place]) 10/2014    by CT  . AAA (abdominal aortic aneurysm) ([place]) 11/2014    by MRI 3cm  . Fatty liver 11/2014    by [place]    Social History   Social History  . Marital Status: Single    Spouse Name: N/A  . Number of Children: N/A  . Years of Education: N/A   Occupational History  . Not on file.   Social History Main Topics  . Smoking status: Current Every Day Smoker -- 0.25 packs/day    Types: Cigarettes    Start date: 08/28/1964  . Smokeless tobacco: Never Used     Comment: 30+ PY  . Alcohol Use: No  . Drug Use: No  . Sexual Activity: Not on file   Other Topics Concern  . 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 Cholecystectomy  2000  . Colonoscopy N/A 01/08/2015    mult TAs Manya Silvas, MD)  . Esophagogastroduodenoscopy N/A  01/08/2015    gastritis, HH Manya Silvas, MD)    Family History  Problem Relation Age of Onset  . Arthritis Mother   . Cancer Brother     lung  . Hypertension Neg Hx   . CAD Neg Hx   . Stroke Neg Hx   . Diabetes Neg Hx     No Known Allergies  Current Outpatient Prescriptions on File Prior to Visit  Medication Sig Dispense Refill  . hydrochlorothiazide (HYDRODIURIL) 25 MG tablet Take 1 tablet (25 mg total) by mouth daily. 90 tablet 3  . Multiple Vitamins-Minerals (MULTIVITAMIN ADULTS 50+ PO) Take 1 tablet by mouth daily.     No current facility-administered medications on file prior to visit.    BP 136/86 mmHg  Pulse 108  Temp(Src) 97.9 F (36.6 C) (Oral)  Ht 5\' 8"  (1.727 m)  Wt 167 lb 12.8 oz (76.114 kg)  BMI 25.52 kg/m2  SpO2 96%    Objective:   Physical Exam  Constitutional: He appears well-nourished.  HENT:  Left Ear: Tympanic membrane and ear canal normal.  Nose: Right sinus exhibits no maxillary sinus tenderness and no frontal sinus  tenderness. Left sinus exhibits no maxillary sinus tenderness and no frontal sinus tenderness.  Mouth/Throat: Oropharynx is clear and moist.  Right TM compacted with cerumen. Irrigation completed. TM post irrigation with moderate/severe erythema. Right tragus tender.  Neck: Neck supple.  Cardiovascular: Normal rate and regular rhythm.   Pulmonary/Chest: Effort normal and breath sounds normal.  Skin: Skin is warm and dry.          Assessment & Plan:  Acute Otitis Media:  Located to right ear since Wednesday this week. No improvement with tylenol.  Right canal impacted with cerumen. Moderate amount of cerumen removed. Post irrigation with moderate/severe erythema to TM, also some to canal from irrigation. Appears infectious. Cover with Amoxil course and tylenol. Discussed use of Debrox drops once infection improved. Return precautions provided.

## 2015-10-26 ENCOUNTER — Other Ambulatory Visit: Payer: Self-pay | Admitting: Family Medicine

## 2015-11-08 ENCOUNTER — Encounter: Payer: Self-pay | Admitting: Family Medicine

## 2015-11-09 ENCOUNTER — Other Ambulatory Visit: Payer: Self-pay | Admitting: Family Medicine

## 2015-11-09 DIAGNOSIS — Z1159 Encounter for screening for other viral diseases: Secondary | ICD-10-CM

## 2015-11-09 DIAGNOSIS — N281 Cyst of kidney, acquired: Secondary | ICD-10-CM

## 2015-11-09 DIAGNOSIS — E785 Hyperlipidemia, unspecified: Secondary | ICD-10-CM

## 2015-11-09 DIAGNOSIS — I10 Essential (primary) hypertension: Secondary | ICD-10-CM

## 2015-11-09 DIAGNOSIS — Z125 Encounter for screening for malignant neoplasm of prostate: Secondary | ICD-10-CM

## 2015-11-09 DIAGNOSIS — K760 Fatty (change of) liver, not elsewhere classified: Secondary | ICD-10-CM

## 2015-11-10 ENCOUNTER — Other Ambulatory Visit: Payer: Self-pay | Admitting: Family Medicine

## 2015-11-10 ENCOUNTER — Other Ambulatory Visit (INDEPENDENT_AMBULATORY_CARE_PROVIDER_SITE_OTHER): Payer: Medicare Other

## 2015-11-10 DIAGNOSIS — E785 Hyperlipidemia, unspecified: Secondary | ICD-10-CM

## 2015-11-10 DIAGNOSIS — I10 Essential (primary) hypertension: Secondary | ICD-10-CM

## 2015-11-10 DIAGNOSIS — K760 Fatty (change of) liver, not elsewhere classified: Secondary | ICD-10-CM | POA: Diagnosis not present

## 2015-11-10 DIAGNOSIS — N281 Cyst of kidney, acquired: Secondary | ICD-10-CM

## 2015-11-10 DIAGNOSIS — Z1159 Encounter for screening for other viral diseases: Secondary | ICD-10-CM | POA: Diagnosis not present

## 2015-11-10 LAB — LIPID PANEL
CHOLESTEROL: 234 mg/dL — AB (ref 0–200)
HDL: 39.9 mg/dL (ref >39.00)
LDL CALC: 164 mg/dL — AB (ref 0–99)
NONHDL: 194.24
Total CHOL/HDL Ratio: 6
Triglycerides: 151 mg/dL — ABNORMAL HIGH (ref 0.0–149.0)
VLDL: 30.2 mg/dL (ref 0.0–40.0)

## 2015-11-10 LAB — COMPREHENSIVE METABOLIC PANEL
ALBUMIN: 3.8 g/dL (ref 3.5–5.2)
ALT: 16 U/L (ref 0–53)
AST: 16 U/L (ref 0–37)
Alkaline Phosphatase: 78 U/L (ref 39–117)
BUN: 10 mg/dL (ref 6–23)
CO2: 30 meq/L (ref 19–32)
Calcium: 10.1 mg/dL (ref 8.4–10.5)
Chloride: 100 mEq/L (ref 96–112)
Creatinine, Ser: 0.97 mg/dL (ref 0.40–1.50)
GFR: 81.21 mL/min (ref >60.00)
Glucose, Bld: 127 mg/dL — ABNORMAL HIGH (ref 70–99)
POTASSIUM: 4.4 meq/L (ref 3.5–5.1)
Sodium: 138 mEq/L (ref 135–145)
Total Bilirubin: 0.5 mg/dL (ref 0.2–1.2)
Total Protein: 8.4 g/dL — ABNORMAL HIGH (ref 6.0–8.3)

## 2015-11-10 LAB — TSH: TSH: 2.38 u[IU]/mL (ref 0.35–4.50)

## 2015-11-11 LAB — HEPATITIS C ANTIBODY: HCV Ab: NEGATIVE

## 2015-11-15 ENCOUNTER — Encounter: Payer: Medicare Other | Admitting: Family Medicine

## 2015-11-22 ENCOUNTER — Ambulatory Visit (INDEPENDENT_AMBULATORY_CARE_PROVIDER_SITE_OTHER): Payer: Medicare Other | Admitting: Family Medicine

## 2015-11-22 ENCOUNTER — Encounter: Payer: Self-pay | Admitting: Family Medicine

## 2015-11-22 VITALS — BP 130/84 | HR 100 | Temp 97.9°F | Wt 165.5 lb

## 2015-11-22 DIAGNOSIS — R7303 Prediabetes: Secondary | ICD-10-CM | POA: Insufficient documentation

## 2015-11-22 DIAGNOSIS — R739 Hyperglycemia, unspecified: Secondary | ICD-10-CM

## 2015-11-22 DIAGNOSIS — H6593 Unspecified nonsuppurative otitis media, bilateral: Secondary | ICD-10-CM | POA: Diagnosis not present

## 2015-11-22 DIAGNOSIS — H6092 Unspecified otitis externa, left ear: Secondary | ICD-10-CM | POA: Diagnosis not present

## 2015-11-22 DIAGNOSIS — H60502 Unspecified acute noninfective otitis externa, left ear: Secondary | ICD-10-CM

## 2015-11-22 MED ORDER — CIPROFLOXACIN HCL 0.2 % OT SOLN: 0.2000 mL | Freq: Two times a day (BID) | OTIC | Status: DC

## 2015-11-22 MED ORDER — AMOXICILLIN 875 MG PO TABS: 875.0000 mg | ORAL_TABLET | Freq: Two times a day (BID) | ORAL | Status: DC

## 2015-11-22 MED ORDER — PREDNISONE 20 MG PO TABS: ORAL_TABLET | ORAL | Status: DC

## 2015-11-22 NOTE — Assessment & Plan Note (Signed)
Unable to visualize TM - will treat with cipro Otic drops.

## 2015-11-22 NOTE — Progress Notes (Signed)
Pre visit review using our clinic review tool, if applicable. No additional management support is needed unless otherwise documented below in the visit note. 

## 2015-11-22 NOTE — Assessment & Plan Note (Signed)
Treat with second amox course + prednisone taper for marked sinus congestion evident today.

## 2015-11-22 NOTE — Patient Instructions (Signed)
For your ear infection - treat with oral antibiotic, steroid course and ear drops. If persistent trouble hearing, let us know to see ear doctor as I was unable to get good look at your left ear drum. We did not perform physical today due to trouble hearing.  Return for medicare wellness visit at your convenience over next few months.

## 2015-11-22 NOTE — Assessment & Plan Note (Signed)
Reviewed with patient - encouraged decreased added sugars.

## 2015-11-22 NOTE — Progress Notes (Signed)
BP 130/84 mmHg  Pulse 100  Temp(Src) 97.9 F (36.6 C) (Oral)  Wt 165 lb 8 oz (75.07 kg)   CC: medicare wellness visit -> converted to acute visit. Subjective:    Patient ID: Gregory Colon, male    DOB: May 29, 1946, 71 y.o.   MRN: [id number]  HPI: Gregory Colon is a 70 y.o. male presenting on 11/22/2015 for Annual Exam and Otitis Media   Seen last month by Anda Kraft with acute pain and loss of hearing of right ear, dx acute otitis of right ear - treated with amoxicillin course. Felt this cleared up but hearing never returned on right. Now over last few days endorses L ear pain. Hearing loss started suddenly a few months ago. Endorses worsening congestion with increase in pollen - taking allergy medication.   Smoking down to <1/2 ppd. motivated to quit.   Failed hearing screen.  Upcoming eye doctor appointment 11/2015. Denies falls in last year Denies depression/anhedonia, sadness.  Preventative: Colon cancer screening - discussed, requests stool kit Prostate cancer screening - no fmhx. Discussed, would like to defer screening. Lung cancer screening - discussed pros/cons of screening with low dose CT scan. Pt interested. [facility] 07/2014 zostavax - will check with insurance. Advanced directive - would want daughter Cristy to be HCPOA. Doesn't want prolonged life support. Packet provided today.  Lives alone, divorced, father of [name] Occupation: retired, was Pension scheme manager Activity: yardwork - cutting wood Diet: some water, fruits/vegetables some  CT CHEST WITHOUT CONTRAST IMPRESSION: 1. Lung-RADS Category 2, benign appearance or behavior. Continue annual screening with low-dose chest CT without contrast in 12 months. 6 mm left upper lobe pulmonary nodule. 2. Centrilobular and paraseptal emphysema. 3. Atherosclerosis, including within the coronary arteries. 4. Probable hepatic cysts. Suboptimally evaluated on this precontrast CT. 5. Right  renal lesions which are likely cysts. Calcification within an interpolar right renal lesion suggests complexity. Consider further evaluation with renal ultrasound. 6. Probable sebaceous cyst about the lateral left chest wall. Consider physical exam correlation. Electronically Signed  By: Abigail Miyamoto M.D.  On: 11/19/2014 09:19  MRI ABDOMEN WITHOUT AND WITH CONTRAST IMPRESSION: 1. Multiple renal lesions in the kidneys bilaterally (right greater than left), compatible with a combination of simple cysts and minimally complex cysts (some of which are proteinaceous or hemorrhagic), as above. 2. Multiple hepatic cysts and/or biliary hamartomas incidentally noted. 3. Atherosclerosis, including mild fusiform aneurysmal dilatation of the infrarenal abdominal aorta which measures 3 cm in diameter. Electronically Signed  By: Vinnie Langton M.D.  On: 12/25/2014 14:04  Relevant past medical, surgical, family and social history reviewed and updated as indicated. Interim medical history since our last visit reviewed. Allergies and medications reviewed and updated. Current Outpatient Prescriptions on File Prior to Visit  Medication Sig  . hydrochlorothiazide (HYDRODIURIL) 25 MG tablet TAKE 1 TABLET (25 MG TOTAL) BY MOUTH DAILY.  . Multiple Vitamins-Minerals (MULTIVITAMIN ADULTS 50+ PO) Take 1 tablet by mouth daily.   No current facility-administered medications on file prior to visit.    Review of Systems Per HPI unless specifically indicated in ROS section     Objective:    BP 130/84 mmHg  Pulse 100  Temp(Src) 97.9 F (36.6 C) (Oral)  Wt 165 lb 8 oz (75.07 kg)  Wt Readings from Last 3 Encounters:  11/22/15 165 lb 8 oz (75.07 kg)  10/15/15 167 lb 12.8 oz (76.114 kg)  01/08/15 162 lb (73.483 kg)    Physical Exam  Constitutional:  He appears well-developed and well-nourished. No distress.  HENT:  Head: Normocephalic and atraumatic.  Right Ear: External ear and ear canal normal.  Decreased hearing is noted.  Left Ear: External ear normal. Decreased hearing is noted.  Nose: Mucosal edema present. No rhinorrhea. Right sinus exhibits no maxillary sinus tenderness and no frontal sinus tenderness. Left sinus exhibits no maxillary sinus tenderness and no frontal sinus tenderness.  Mouth/Throat: Uvula is midline, oropharynx is clear and moist and mucous membranes are normal. No oropharyngeal exudate, posterior oropharyngeal edema, posterior oropharyngeal erythema or tonsillar abscesses.  Complete loss of hearing bilaterally Sinus congestion evident L canal with purulent drainage, unable to evaluate TM due to deep canals R canal with some cerumen, edge of erythematous TM visualized Marked nasal mucosal edema blocking nasal passage R>L  Eyes: Conjunctivae and EOM are normal. Pupils are equal, round, and reactive to light. No scleral icterus.  Neck: Normal range of motion. Neck supple.  Cardiovascular: Normal rate, regular rhythm, normal heart sounds and intact distal pulses.   No murmur heard. Pulmonary/Chest: Effort normal and breath sounds normal. No respiratory distress. He has no wheezes. He has no rales.  Lymphadenopathy:    He has no cervical adenopathy.  Skin: Skin is warm and dry. No rash noted.  Nursing note and vitals reviewed.  Results for orders placed or performed in visit on 11/10/15  Lipid panel  Result Value Ref Range   Cholesterol 234 (H) 0 - 200 mg/dL   Triglycerides 151.0 (H) 0.0 - 149.0 mg/dL   HDL 39.90 >39.00 mg/dL   VLDL 30.2 0.0 - 40.0 mg/dL   LDL Cholesterol 164 (H) 0 - 99 mg/dL   Total CHOL/HDL Ratio 6    NonHDL 194.24   Comprehensive metabolic panel  Result Value Ref Range   Sodium 138 135 - 145 mEq/L   Potassium 4.4 3.5 - 5.1 mEq/L   Chloride 100 96 - 112 mEq/L   CO2 30 19 - 32 mEq/L   Glucose, Bld 127 (H) 70 - 99 mg/dL   BUN 10 6 - 23 mg/dL   Creatinine, Ser 0.97 0.40 - 1.50 mg/dL   Total Bilirubin 0.5 0.2 - 1.2 mg/dL   Alkaline  Phosphatase 78 39 - 117 U/L   AST 16 0 - 37 U/L   ALT 16 0 - 53 U/L   Total Protein 8.4 (H) 6.0 - 8.3 g/dL   Albumin 3.8 3.5 - 5.2 g/dL   Calcium 10.1 8.4 - 10.5 mg/dL   GFR 81.21 >60.00 mL/min  TSH  Result Value Ref Range   TSH 2.38 0.35 - 4.50 uIU/mL      Assessment & Plan:   Problem List Items Addressed This Visit    Hyperglycemia    Reviewed with patient - encouraged decreased added sugars.      Bilateral serous otitis media    Treat with second amox course + prednisone taper for marked sinus congestion evident today.       Relevant Medications   amoxicillin (AMOXIL) 875 MG tablet   Acute otitis externa of left ear - Primary    Unable to visualize TM - will treat with cipro Otic drops.          Follow up plan: Return for medicare wellness visit.

## 2015-11-22 NOTE — Telephone Encounter (Signed)
Please see Mychart message.

## 2015-11-24 MED ORDER — OFLOXACIN 0.3 % OT SOLN: 5.0000 [drp] | Freq: Every day | OTIC | Status: DC

## 2015-11-24 NOTE — Telephone Encounter (Signed)
Spoke with daughter.  Pt did not fill cipro ear drops - too expensive.  Will send ofloxacin instead.  Advised should see improvement each week in hearing, if not to update [place] for ENT referral.

## 2015-11-25 ENCOUNTER — Encounter: Payer: Self-pay | Admitting: Family Medicine

## 2015-11-25 DIAGNOSIS — H9123 Sudden idiopathic hearing loss, bilateral: Secondary | ICD-10-CM

## 2015-11-25 DIAGNOSIS — H6593 Unspecified nonsuppurative otitis media, bilateral: Secondary | ICD-10-CM

## 2015-11-25 DIAGNOSIS — H60502 Unspecified acute noninfective otitis externa, left ear: Secondary | ICD-10-CM

## 2015-12-09 DIAGNOSIS — H6121 Impacted cerumen, right ear: Secondary | ICD-10-CM | POA: Diagnosis not present

## 2015-12-09 DIAGNOSIS — H90A32 Mixed conductive and sensorineural hearing loss, unilateral, left ear with restricted hearing on the contralateral side: Secondary | ICD-10-CM | POA: Diagnosis not present

## 2015-12-09 DIAGNOSIS — H903 Sensorineural hearing loss, bilateral: Secondary | ICD-10-CM | POA: Diagnosis not present

## 2015-12-09 DIAGNOSIS — H6530 Chronic mucoid otitis media, unspecified ear: Secondary | ICD-10-CM | POA: Diagnosis not present

## 2015-12-20 ENCOUNTER — Ambulatory Visit (INDEPENDENT_AMBULATORY_CARE_PROVIDER_SITE_OTHER)
Admission: RE | Admit: 2015-12-20 | Discharge: 2015-12-20 | Disposition: A | Discharge status: Home / Self Care | Payer: Medicare Other | Source: Ambulatory Visit | Attending: Internal Medicine | Admitting: Internal Medicine

## 2015-12-20 ENCOUNTER — Ambulatory Visit (INDEPENDENT_AMBULATORY_CARE_PROVIDER_SITE_OTHER): Payer: Medicare Other | Admitting: Internal Medicine

## 2015-12-20 ENCOUNTER — Encounter: Payer: Self-pay | Admitting: Internal Medicine

## 2015-12-20 VITALS — BP 132/92 | HR 102 | Temp 97.8°F | Wt 162.0 lb

## 2015-12-20 DIAGNOSIS — M7989 Other specified soft tissue disorders: Secondary | ICD-10-CM

## 2015-12-20 DIAGNOSIS — M25432 Effusion, left wrist: Secondary | ICD-10-CM | POA: Diagnosis not present

## 2015-12-20 MED ORDER — INDOMETHACIN 50 MG PO CAPS: 50.0000 mg | ORAL_CAPSULE | Freq: Two times a day (BID) | ORAL | Status: DC

## 2015-12-20 NOTE — Progress Notes (Signed)
Subjective:    Patient ID: Gregory Colon, male    DOB: 20-Aug-1946, 70 y.o.   MRN: [id number]  HPI  Pt presents to the clinic today with c/o left hand swelling with intermittent pain. This started about 1 month ago. He describes the pain as stiff. He has pain when he lifts things with his hand and it seems worse at the end of the day. He denies trauma, falls, or injury. He has tried soaking his hand  epsom salts and has used Voltaren gel, both without relief. He has no h/o gout or arthritis.   Review of Systems   Past Medical History  Diagnosis Date  . Arthritis     R hand  . History of chicken pox   . History of kidney stones 1966  . Smoker   . Liver cyst     multiple  . Kidney cysts 11/2014    R>L  . CAD (coronary artery disease) 10/2014    by CT  . Emphysema of lung ([place]) 10/2014    by CT  . AAA (abdominal aortic aneurysm) ([place]) 11/2014    by MRI 3cm  . Fatty liver 11/2014    by [place]    Current Outpatient Prescriptions  Medication Sig Dispense Refill  . hydrochlorothiazide (HYDRODIURIL) 25 MG tablet TAKE 1 TABLET (25 MG TOTAL) BY MOUTH DAILY. 90 tablet 0  . Multiple Vitamins-Minerals (MULTIVITAMIN ADULTS 50+ PO) Take 1 tablet by mouth daily.    . indomethacin (INDOCIN) 50 MG capsule Take 1 capsule (50 mg total) by mouth 2 (two) times daily with a meal. 60 capsule 0   No current facility-administered medications for this visit.    No Known Allergies  Family History  Problem Relation Age of Onset  . Arthritis Mother   . Cancer Brother     lung  . Hypertension Neg Hx   . CAD Neg Hx   . Stroke Neg Hx   . Diabetes Neg Hx     Social History   Social History  . Marital Status: Single    Spouse Name: N/A  . Number of Children: N/A  . Years of Education: N/A   Occupational History  . Not on file.   Social History Main Topics  . Smoking status: Current Every Day Smoker -- 0.25 packs/day    Types: Cigarettes    Start date: 08/28/1964  . Smokeless tobacco:  Never Used     Comment: 30+ PY  . Alcohol Use: No  . Drug Use: No  . Sexual Activity: Not on file   Other Topics Concern  . 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 He denies muscle pain or difficulty with gait.  Neuro: Denies numbness or tingling in his hands or feet.  No other specific complaints in a complete review of systems (except as listed in HPI above).     Objective:   Physical Exam  BP 132/92 mmHg  Pulse 102  Temp(Src) 97.8 F (36.6 C) (Oral)  Wt 162 lb (73.483 kg)  SpO2 97% Wt Readings from Last 3 Encounters:  12/20/15 162 lb (73.483 kg)  11/22/15 165 lb 8 oz (75.07 kg)  10/15/15 167 lb 12.8 oz (76.114 kg)  General: Appears his stated age, in NAD. Skin: Warm, dry and intact.  Cardiovascular: Normal rate and rhythm. S1,S2 noted.  No murmur, rubs or gallops noted.  Pulmonary/Chest: Normal effort and positive vesicular breath sounds. No respiratory distress. No wheezes, rales or ronchi noted.  Musculoskeletal: L hand swelling from palm to just proximal to the wrist. +  mild warmth but no redness. Full ROM of elbow and fingers. Decreased ROM of left wrist. TTP of thenar aspect. 5/5 strength bilaterally. No swelling or pain noted in R hand.    BMET    Component Value Date/Time   NA 138 11/10/2015 0819   NA 136 08/25/2014 2004   NA 136* 08/25/2014   K 4.4 11/10/2015 0819   K 4.3 08/25/2014 2004   CL 100 11/10/2015 0819   CL 105 08/25/2014 2004   CO2 30 11/10/2015 0819   CO2 25 08/25/2014 2004   GLUCOSE 127* 11/10/2015 0819   GLUCOSE 99 08/25/2014 2004   BUN 10 11/10/2015 0819   BUN 17 08/25/2014 2004   CREATININE 0.97 11/10/2015 0819   CREATININE 0.98 08/25/2014 2004    CREATININE 1.0 08/25/2014   CALCIUM 10.1 11/10/2015 0819   CALCIUM 9.1 08/25/2014 2004   GFRNONAA >60 08/25/2014 2004   GFRAA >60 08/25/2014 2004    Lipid Panel     Component Value Date/Time   CHOL 234* 11/10/2015 0819   TRIG 151.0* 11/10/2015 0819   HDL 39.90 11/10/2015 0819   CHOLHDL 6 11/10/2015 0819   VLDL 30.2 11/10/2015 0819   LDLCALC 164* 11/10/2015 0819    CBC    Component Value Date/Time   WBC 10.9* 08/25/2014 2004   WBC 10.9 08/25/2014   RBC 5.14 08/25/2014 2004   HGB 15.6 08/25/2014 2004   HGB 15.6 08/25/2014   HCT 47.3 08/25/2014 2004   PLT 293 08/25/2014 2004   PLT 293 08/25/2014   MCV 92 08/25/2014 2004   MCH 30.3 08/25/2014 2004   MCHC 33.0 08/25/2014 2004   RDW 13.8 08/25/2014 2004   LYMPHSABS 3.2 08/25/2014 2004   MONOABS 1.0 08/25/2014 2004   EOSABS 0.3 08/25/2014 2004   BASOSABS 0.1 08/25/2014 2004    Hgb A1C No results found for: HGBA1C       Assessment & Plan:   Left hand/wrist swelling:  ? Gout vs arthritis Xray of left hand today Start Indomethacin 50 mg BID with food If persist, consider uric acid level  Will follow up after xray, RTC as needed

## 2015-12-20 NOTE — Patient Instructions (Signed)

## 2015-12-20 NOTE — Progress Notes (Signed)
Pre visit review using our clinic review tool, if applicable. No additional management support is needed unless otherwise documented below in the visit note. 

## 2016-01-30 ENCOUNTER — Other Ambulatory Visit: Payer: Self-pay | Admitting: Family Medicine

## 2016-02-18 ENCOUNTER — Encounter: Payer: Medicare Other | Admitting: Family Medicine

## 2016-03-02 ENCOUNTER — Ambulatory Visit (INDEPENDENT_AMBULATORY_CARE_PROVIDER_SITE_OTHER): Payer: Medicare Other | Admitting: Family Medicine

## 2016-03-02 ENCOUNTER — Encounter: Payer: Self-pay | Admitting: Family Medicine

## 2016-03-02 VITALS — BP 118/82 | HR 64 | Temp 97.7°F | Ht 68.0 in | Wt 160.8 lb

## 2016-03-02 DIAGNOSIS — F17200 Nicotine dependence, unspecified, uncomplicated: Secondary | ICD-10-CM

## 2016-03-02 DIAGNOSIS — I714 Abdominal aortic aneurysm, without rupture, unspecified: Secondary | ICD-10-CM

## 2016-03-02 DIAGNOSIS — I10 Essential (primary) hypertension: Secondary | ICD-10-CM

## 2016-03-02 DIAGNOSIS — M6580 Other synovitis and tenosynovitis, unspecified site: Secondary | ICD-10-CM

## 2016-03-02 DIAGNOSIS — M189 Osteoarthritis of first carpometacarpal joint, unspecified: Secondary | ICD-10-CM | POA: Insufficient documentation

## 2016-03-02 DIAGNOSIS — M25432 Effusion, left wrist: Secondary | ICD-10-CM

## 2016-03-02 DIAGNOSIS — M25532 Pain in left wrist: Secondary | ICD-10-CM

## 2016-03-02 DIAGNOSIS — Z23 Encounter for immunization: Secondary | ICD-10-CM

## 2016-03-02 DIAGNOSIS — J432 Centrilobular emphysema: Secondary | ICD-10-CM

## 2016-03-02 DIAGNOSIS — Z0000 Encounter for general adult medical examination without abnormal findings: Secondary | ICD-10-CM

## 2016-03-02 DIAGNOSIS — M119 Crystal arthropathy, unspecified: Secondary | ICD-10-CM | POA: Insufficient documentation

## 2016-03-02 DIAGNOSIS — E785 Hyperlipidemia, unspecified: Secondary | ICD-10-CM

## 2016-03-02 DIAGNOSIS — R739 Hyperglycemia, unspecified: Secondary | ICD-10-CM

## 2016-03-02 DIAGNOSIS — Z7189 Other specified counseling: Secondary | ICD-10-CM

## 2016-03-02 DIAGNOSIS — I2510 Atherosclerotic heart disease of native coronary artery without angina pectoris: Secondary | ICD-10-CM

## 2016-03-02 MED ORDER — PREDNISONE 20 MG PO TABS: ORAL_TABLET | ORAL | Status: DC

## 2016-03-02 MED ORDER — ATORVASTATIN CALCIUM 40 MG PO TABS: 40.0000 mg | ORAL_TABLET | Freq: Every day | ORAL | Status: DC

## 2016-03-02 NOTE — Assessment & Plan Note (Signed)
Preventative protocols reviewed and updated unless pt declined. Discussed healthy diet and lifestyle.  

## 2016-03-02 NOTE — Progress Notes (Signed)
BP 118/82 mmHg  Pulse 64  Temp(Src) 97.7 F (36.5 C) (Oral)  Ht 5\' 8"  (1.727 m)  Wt 160 lb 12 oz (72.916 kg)  BMI 24.45 kg/m2   CC: medicare wellness visit  Subjective:    Patient ID: Gregory Colon, male    DOB: [birth year], 70 y.o.   MRN: [id number]  HPI: Gregory Colon is a 70 y.o. male presenting on 03/02/2016 for Annual Exam   Endorses left wrist pain since 11/2015 - ongoing pain since then despite finishing indocin course. Warm and swollen. No redness. Denies inciting trauma. Worse with increased use. No known h/o gout.   Passes hearing screen.  Recent vision screen 12/2015 Denies falls in last year Denies depression/anhedonia, sadness.  Preventative: COLONOSCOPY Laterality: N/A Date: 01/08/2015 mult TAs Manya Silvas, MD) Prostate cancer screening - no fmhx. Discussed, would like to defer screening. Lung cancer screening - completed 10/2014. Desires to defer rpt CT for now. Flu yearly at [facility] 07/2014, pneumovax today zostavax - will check with insurance. Advanced directive - would want daughter Gregory Colon to be HCPOA. Doesn't want prolonged life support. Not interested in setting up. Seat belt use discussed Sunscreen use discussed. No changing moles on skin.   Lives alone, divorced, father of Gregory Colon Occupation: retired, was [employer] Activity: yardwork - cutting wood Diet: some water, fruits/vegetables some  Relevant past medical, surgical, family and social history reviewed and updated as indicated. Interim medical history since our last visit reviewed. Allergies and medications reviewed and updated. Current Outpatient Prescriptions on File Prior to Visit  Medication Sig  . hydrochlorothiazide (HYDRODIURIL) 25 MG tablet TAKE 1 TABLET (25 MG TOTAL) BY MOUTH DAILY.  . Multiple Vitamins-Minerals (MULTIVITAMIN ADULTS 50+ PO) Take 1 tablet by mouth daily.   No current facility-administered medications on file prior to visit.     Review of Systems  Constitutional: Negative for fever, chills, activity change, appetite change, fatigue and unexpected weight change.  HENT: Negative for hearing loss.   Eyes: Negative for visual disturbance.  Respiratory: Negative for cough, chest tightness, shortness of breath and wheezing.   Cardiovascular: Negative for chest pain, palpitations and leg swelling.  Gastrointestinal: Negative for nausea, vomiting, abdominal pain, diarrhea, constipation, blood in stool and abdominal distention.  Genitourinary: Negative for hematuria and difficulty urinating.  Musculoskeletal: Negative for myalgias, arthralgias and neck pain.  Skin: Negative for rash.  Neurological: Negative for dizziness, seizures, syncope and headaches.  Hematological: Negative for adenopathy. Bruises/bleeds easily.  Psychiatric/Behavioral: Negative for dysphoric mood. The patient is not nervous/anxious.    Per HPI unless specifically indicated in ROS section     Objective:    BP 118/82 mmHg  Pulse 64  Temp(Src) 97.7 F (36.5 C) (Oral)  Ht 5\' 8"  (1.727 m)  Wt 160 lb 12 oz (72.916 kg)  BMI 24.45 kg/m2  Wt Readings from Last 3 Encounters:  03/02/16 160 lb 12 oz (72.916 kg)  12/20/15 162 lb (73.483 kg)  11/22/15 165 lb 8 oz (75.07 kg)    Physical Exam  Constitutional: He is oriented to person, place, and time. He appears well-developed and well-nourished. No distress.  HENT:  Head: Normocephalic and atraumatic.  Right Ear: Hearing, tympanic membrane, external ear and ear canal normal.  Left Ear: Hearing, tympanic membrane, external ear and ear canal normal.  Nose: Nose normal.  Mouth/Throat: Uvula is midline, oropharynx is clear and moist and mucous membranes are normal. No oropharyngeal exudate, posterior oropharyngeal edema or posterior oropharyngeal  erythema.  Eyes: Conjunctivae and EOM are normal. Pupils are equal, round, and reactive to light. No scleral icterus.  Neck: Normal range of motion. Neck  supple. Carotid bruit is not present. No thyromegaly present.  Cardiovascular: Normal rate, regular rhythm, normal heart sounds and intact distal pulses.   No murmur heard. Pulses:      Radial pulses are 2+ on the right side, and 2+ on the left side.  Pulmonary/Chest: Effort normal and breath sounds normal. No respiratory distress. He has no wheezes. He has no rales.  Abdominal: Soft. Bowel sounds are normal. He exhibits no distension and no mass. There is no tenderness. There is no rebound and no guarding.  Musculoskeletal: Normal range of motion. He exhibits no edema.  L wrist swelling present along with tenderness and limited ROM at wrist predominantly extension and lateral flexion No erythema, no significant warmth  Lymphadenopathy:    He has no cervical adenopathy.  Neurological: He is alert and oriented to person, place, and time.  CN grossly intact, station and gait intact Recall 1/3, 3/3 with cue Calculation 5/5 serial 3s  Skin: Skin is warm and dry. No rash noted.  Psychiatric: He has a normal mood and affect. His behavior is normal. Judgment and thought content normal.  Nursing note and vitals reviewed.  Results for orders placed or performed in visit on 11/10/15  Lipid panel  Result Value Ref Range   Cholesterol 234 (H) 0 - 200 mg/dL   Triglycerides 151.0 (H) 0.0 - 149.0 mg/dL   HDL 39.90 >39.00 mg/dL   VLDL 30.2 0.0 - 40.0 mg/dL   LDL Cholesterol 164 (H) 0 - 99 mg/dL   Total CHOL/HDL Ratio 6    NonHDL 194.24   Comprehensive metabolic panel  Result Value Ref Range   Sodium 138 135 - 145 mEq/L   Potassium 4.4 3.5 - 5.1 mEq/L   Chloride 100 96 - 112 mEq/L   CO2 30 19 - 32 mEq/L   Glucose, Bld 127 (H) 70 - 99 mg/dL   BUN 10 6 - 23 mg/dL   Creatinine, Ser 0.97 0.40 - 1.50 mg/dL   Total Bilirubin 0.5 0.2 - 1.2 mg/dL   Alkaline Phosphatase 78 39 - 117 U/L   AST 16 0 - 37 U/L   ALT 16 0 - 53 U/L   Total Protein 8.4 (H) 6.0 - 8.3 g/dL   Albumin 3.8 3.5 - 5.2 g/dL    Calcium 10.1 8.4 - 10.5 mg/dL   GFR 81.21 >60.00 mL/min  TSH  Result Value Ref Range   TSH 2.38 0.35 - 4.50 uIU/mL      Assessment & Plan:   Problem List Items Addressed This Visit    Benign essential HTN    Chronic, stable. Continue hctz.       Relevant Medications   atorvastatin (LIPITOR) 40 MG tablet   Smoker    Continue to encourage cessation. Contemplative.  Suggested rpt lung cancer screening CT (last 10/2014). Pt prefers to defer for now.       Medicare annual wellness visit, subsequent - Primary    I have personally reviewed the Medicare Annual Wellness questionnaire and have noted 1. The patient's medical and social history 2. Their use of alcohol, tobacco or illicit drugs 3. Their current medications and supplements 4. The patient's functional ability including ADL's, fall risks, home safety risks and hearing or visual impairment. Cognitive function has been assessed and addressed as indicated.  5. Diet and physical activity 6.  Evidence for depression or mood disorders The patients weight, height, BMI have been recorded in the chart. I have made referrals, counseling and provided education to the patient based on review of the above and I have provided the pt with a written personalized care plan for preventive services. Provider list updated.. See scanned questionairre as needed for further documentation. Reviewed preventative protocols and updated unless pt declined.       Health maintenance examination    Preventative protocols reviewed and updated unless pt declined. Discussed healthy diet and lifestyle.       Advanced care planning/counseling discussion    Advanced directive - would want daughter Gregory Colon to be HCPOA. Doesn't want prolonged life support. Not interested in setting up.      HLD (hyperlipidemia)    Chronic, deteriorated. Known CAD.  ASCVD 87yr risk score = 26.1%. Start lipitor 40mg  daily. Discussed monitoring for myalgias. Pt agrees with  plan.      Relevant Medications   atorvastatin (LIPITOR) 40 MG tablet   CAD (coronary artery disease)    85yr ASCVD risk estimator = 26.1%. No strong fmhx. Start statin. Consider aspirin next visit.       Relevant Medications   atorvastatin (LIPITOR) 40 MG tablet   Emphysema of lung ([place])    By imaging. Denies significant dyspnea. Continue to encourage cessation.       Relevant Medications   predniSONE (DELTASONE) 20 MG tablet   AAA (abdominal aortic aneurysm) ([facility])    Mild fusiform dilatation of infrarenal abdominal aorta 3cm (12/2014). Will discuss rpt AAA duplex next visit.      Relevant Medications   atorvastatin (LIPITOR) 40 MG tablet   Hyperglycemia    Check A1c next visit.      Pain and swelling of left wrist    Reviewed xray from 11/2015. Ongoing pain/swelling over last 3 months, indocin without significant improvement. Suspicious for developing gout - treat with prednisone course. If recurrent swelling/pain, pt will notify me for labwork to further evaluate gout.        Other Visit Diagnoses    Need for 23-polyvalent pneumococcal polysaccharide vaccine        Relevant Orders    Pneumococcal polysaccharide vaccine 23-valent greater than or equal to 2yo subcutaneous/IM (Completed)        Follow up plan: No Follow-up on file.  [name], MD

## 2016-03-02 NOTE — Patient Instructions (Addendum)
Touch base with Dr Elliott's office about repeat colonoscopy.  Pneumovax today. Start lipitor cholesterol medicine sent to pharmacy (40mg  nightly) - watch for muscle aches and let me know if this happens. Recheck cholesterol levels in 3 months.  Start steroid course sent to pharmacy for left wrist. Should help with pain/swelling. If pain/swelling returns, let us know for further evaluation (labwork).   Health Maintenance, Male A healthy lifestyle and preventative care can promote health and wellness.  Maintain regular health, dental, and eye exams.  Eat a healthy diet. Foods like vegetables, fruits, whole grains, low-fat dairy products, and lean protein foods contain the nutrients you need and are low in calories. Decrease your intake of foods high in solid fats, added sugars, and salt. Get information about a proper diet from your health care provider, if necessary.  Regular physical exercise is one of the most important things you can do for your health. Most adults should get at least 150 minutes of moderate-intensity exercise (any activity that increases your heart rate and causes you to sweat) each week. In addition, most adults need muscle-strengthening exercises on 2 or more days a week.   Maintain a healthy weight. The body mass index (BMI) is a screening tool to identify possible weight problems. It provides an estimate of body fat based on height and weight. Your health care provider can find your BMI and can help you achieve or maintain a healthy weight. For males 20 years and older:  A BMI below 18.5 is considered underweight.  A BMI of 18.5 to 24.9 is normal.  A BMI of 25 to 29.9 is considered overweight.  A BMI of 30 and above is considered obese.  Maintain normal blood lipids and cholesterol by exercising and minimizing your intake of saturated fat. Eat a balanced diet with plenty of fruits and vegetables. Blood tests for lipids and cholesterol should begin at age 1 and be  repeated every 5 years. If your lipid or cholesterol levels are high, you are over age 71, or you are at high risk for heart disease, you may need your cholesterol levels checked more frequently.Ongoing high lipid and cholesterol levels should be treated with medicines if diet and exercise are not working.  If you smoke, find out from your health care provider how to quit. If you do not use tobacco, do not start.  Lung cancer screening is recommended for adults aged 75-80 years who are at high risk for developing lung cancer because of a history of smoking. A yearly low-dose CT scan of the lungs is recommended for people who have at least a 30-pack-year history of smoking and are current smokers or have quit within the past 15 years. A pack year of smoking is smoking an average of 1 pack of cigarettes a day for 1 year (for example, a 30-pack-year history of smoking could mean smoking 1 pack a day for 30 years or 2 packs a day for 15 years). Yearly screening should continue until the smoker has stopped smoking for at least 15 years. Yearly screening should be stopped for people who develop a health problem that would prevent them from having lung cancer treatment.  If you choose to drink alcohol, do not have more than 2 drinks per day. One drink is considered to be 12 oz (360 mL) of beer, 5 oz (150 mL) of wine, or 1.5 oz (45 mL) of liquor.  Avoid the use of street drugs. Do not share needles with anyone. Ask for  help if you need support or instructions about stopping the use of drugs.  High blood pressure causes heart disease and increases the risk of stroke. High blood pressure is more likely to develop in:  People who have blood pressure in the end of the normal range (100-139/85-89 mm Hg).  People who are overweight or obese.  People who are African American.  If you are 39-9 years of age, have your blood pressure checked every 3-5 years. If you are 10 years of age or older, have your blood  pressure checked every year. You should have your blood pressure measured twice--once when you are at a hospital or clinic, and once when you are not at a hospital or clinic. Record the average of the two measurements. To check your blood pressure when you are not at a hospital or clinic, you can use:  An automated blood pressure machine at a pharmacy.  A home blood pressure monitor.  If you are 77-65 years old, ask your health care provider if you should take aspirin to prevent heart disease.  Diabetes screening involves taking a blood sample to check your fasting blood sugar level. This should be done once every 3 years after age 65 if you are at a normal weight and without risk factors for diabetes. Testing should be considered at a younger age or be carried out more frequently if you are overweight and have at least 1 risk factor for diabetes.  Colorectal cancer can be detected and often prevented. Most routine colorectal cancer screening begins at the age of 36 and continues through age 68. However, your health care provider may recommend screening at an earlier age if you have risk factors for colon cancer. On a yearly basis, your health care provider may provide home test kits to check for hidden blood in the stool. A small camera at the end of a tube may be used to directly examine the colon (sigmoidoscopy or colonoscopy) to detect the earliest forms of colorectal cancer. Talk to your health care provider about this at age 53 when routine screening begins. A direct exam of the colon should be repeated every 5-10 years through age 32, unless early forms of precancerous polyps or small growths are found.  People who are at an increased risk for hepatitis B should be screened for this virus. You are considered at high risk for hepatitis B if:  You were born in a country where hepatitis B occurs often. Talk with your health care provider about which countries are considered high risk.  Your  parents were born in a high-risk country and you have not received a shot to protect against hepatitis B (hepatitis B vaccine).  You have HIV or AIDS.  You use needles to inject street drugs.  You live with, or have sex with, someone who has hepatitis B.  You are a man who has sex with other men (MSM).  You get hemodialysis treatment.  You take certain medicines for conditions like cancer, organ transplantation, and autoimmune conditions.  Hepatitis C blood testing is recommended for all people born from [birth year] through 1966 and any individual with known risk factors for hepatitis C.  Healthy men should no longer receive prostate-specific antigen (PSA) blood tests as part of routine cancer screening. Talk to your health care provider about prostate cancer screening.  Testicular cancer screening is not recommended for adolescents or adult males who have no symptoms. Screening includes self-exam, a health care provider exam, and other screening  tests. Consult with your health care provider about any symptoms you have or any concerns you have about testicular cancer.  Practice safe sex. Use condoms and avoid high-risk sexual practices to reduce the spread of sexually transmitted infections (STIs).  You should be screened for STIs, including gonorrhea and chlamydia if:  You are sexually active and are younger than 24 years.  You are older than 24 years, and your health care provider tells you that you are at risk for this type of infection.  Your sexual activity has changed since you were last screened, and you are at an increased risk for chlamydia or gonorrhea. Ask your health care provider if you are at risk.  If you are at risk of being infected with HIV, it is recommended that you take a prescription medicine daily to prevent HIV infection. This is called pre-exposure prophylaxis (PrEP). You are considered at risk if:  You are a man who has sex with other men (MSM).  You are a  heterosexual man who is sexually active with multiple partners.  You take drugs by injection.  You are sexually active with a partner who has HIV.  Talk with your health care provider about whether you are at high risk of being infected with HIV. If you choose to begin PrEP, you should first be tested for HIV. You should then be tested every 3 months for as long as you are taking PrEP.  Use sunscreen. Apply sunscreen liberally and repeatedly throughout the day. You should seek shade when your shadow is shorter than you. Protect yourself by wearing long sleeves, pants, a wide-brimmed hat, and sunglasses year round whenever you are outdoors.  Tell your health care provider of new moles or changes in moles, especially if there is a change in shape or color. Also, tell your health care provider if a mole is larger than the size of a pencil eraser.  A one-time screening for abdominal aortic aneurysm (AAA) and surgical repair of large AAAs by ultrasound is recommended for men aged 8-75 years who are current or former smokers.  Stay current with your vaccines (immunizations).   This information is not intended to replace advice given to you by your health care provider. Make sure you discuss any questions you have with your health care provider.   Document Released: 02/10/2008 Document Revised: 09/04/2014 Document Reviewed: 01/09/2011 Elsevier Interactive Patient Education [employer].

## 2016-03-02 NOTE — Assessment & Plan Note (Signed)
Reviewed xray from 11/2015. Ongoing pain/swelling over last 3 months, indocin without significant improvement. Suspicious for developing gout - treat with prednisone course. If recurrent swelling/pain, pt will notify me for labwork to further evaluate gout.

## 2016-03-02 NOTE — Assessment & Plan Note (Signed)
Chronic, stable. Continue hctz.  

## 2016-03-02 NOTE — Assessment & Plan Note (Signed)
By imaging. Denies significant dyspnea. Continue to encourage cessation.

## 2016-03-02 NOTE — Assessment & Plan Note (Signed)
49yr ASCVD risk estimator = 26.1%. No strong fmhx. Start statin. Consider aspirin next visit.

## 2016-03-02 NOTE — Assessment & Plan Note (Addendum)
Continue to encourage cessation. Contemplative.  Suggested rpt lung cancer screening CT (last 10/2014). Pt prefers to defer for now.

## 2016-03-02 NOTE — Assessment & Plan Note (Signed)
Mild fusiform dilatation of infrarenal abdominal aorta 3cm (12/2014). Will discuss rpt AAA duplex next visit.

## 2016-03-02 NOTE — Progress Notes (Signed)
Pre visit review using our clinic review tool, if applicable. No additional management support is needed unless otherwise documented below in the visit note. 

## 2016-03-02 NOTE — Assessment & Plan Note (Signed)
Advanced directive - would want daughter Cristy to be HCPOA. Doesn't want prolonged life support. Not interested in setting up. 

## 2016-03-02 NOTE — Assessment & Plan Note (Signed)
Chronic, deteriorated. Known CAD.  ASCVD 39yr risk score = 26.1%. Start lipitor 40mg  daily. Discussed monitoring for myalgias. Pt agrees with plan.

## 2016-03-02 NOTE — Assessment & Plan Note (Signed)
Check A1c next visit.

## 2016-03-02 NOTE — Assessment & Plan Note (Signed)

## 2016-03-06 ENCOUNTER — Encounter: Payer: Self-pay | Admitting: Family Medicine

## 2016-03-14 ENCOUNTER — Encounter: Payer: Self-pay | Admitting: Family Medicine

## 2016-03-14 DIAGNOSIS — M25432 Effusion, left wrist: Secondary | ICD-10-CM

## 2016-03-14 DIAGNOSIS — M25532 Pain in left wrist: Principal | ICD-10-CM

## 2016-03-15 MED ORDER — COLCHICINE 0.6 MG PO TABS: 0.6000 mg | ORAL_TABLET | Freq: Every day | ORAL | Status: DC | PRN

## 2016-03-15 NOTE — Telephone Encounter (Signed)
Labs ordered. plz schedule lab visit this week.  Colchicine sent to pharmacy for recurrent wrist swelling/pain.

## 2016-03-19 ENCOUNTER — Encounter: Payer: Self-pay | Admitting: Family Medicine

## 2016-03-19 DIAGNOSIS — M25432 Effusion, left wrist: Secondary | ICD-10-CM

## 2016-03-19 DIAGNOSIS — M25532 Pain in left wrist: Principal | ICD-10-CM

## 2016-03-20 NOTE — Telephone Encounter (Signed)
plz call to schedule lab visit.  

## 2016-03-21 ENCOUNTER — Other Ambulatory Visit (INDEPENDENT_AMBULATORY_CARE_PROVIDER_SITE_OTHER): Payer: Medicare Other

## 2016-03-21 DIAGNOSIS — M25432 Effusion, left wrist: Secondary | ICD-10-CM | POA: Diagnosis not present

## 2016-03-21 DIAGNOSIS — M25532 Pain in left wrist: Secondary | ICD-10-CM | POA: Diagnosis not present

## 2016-03-21 LAB — CBC WITH DIFFERENTIAL/PLATELET
Basophils Absolute: 0.1 10*3/uL (ref 0.0–0.1)
Basophils Relative: 0.5 % (ref 0.0–3.0)
EOS ABS: 0.2 10*3/uL (ref 0.0–0.7)
Eosinophils Relative: 2.1 % (ref 0.0–5.0)
HCT: 44.7 % (ref 39.0–52.0)
Hemoglobin: 15.3 g/dL (ref 13.0–17.0)
LYMPHS ABS: 2.7 10*3/uL (ref 0.7–4.0)
Lymphocytes Relative: 24.8 % (ref 12.0–46.0)
MCHC: 34.2 g/dL (ref 30.0–36.0)
MCV: 90.2 fl (ref 78.0–100.0)
Monocytes Absolute: 0.9 10*3/uL (ref 0.1–1.0)
Monocytes Relative: 8.6 % (ref 3.0–12.0)
NEUTROS ABS: 6.9 10*3/uL (ref 1.4–7.7)
NEUTROS PCT: 64 % (ref 43.0–77.0)
PLATELETS: 319 10*3/uL (ref 150.0–400.0)
RBC: 4.96 Mil/uL (ref 4.22–5.81)
RDW: 14.7 % (ref 11.5–15.5)
WBC: 10.8 10*3/uL — ABNORMAL HIGH (ref 4.0–10.5)

## 2016-03-21 LAB — SEDIMENTATION RATE: Sed Rate: 37 mm/hr — ABNORMAL HIGH (ref 0–20)

## 2016-03-21 LAB — URIC ACID: URIC ACID, SERUM: 6.3 mg/dL (ref 4.0–7.8)

## 2016-03-22 LAB — RHEUMATOID FACTOR: Rhuematoid fact SerPl-aCnc: <10 IU/mL (ref <=14)

## 2016-03-26 MED ORDER — NAPROXEN 500 MG PO TABS: ORAL_TABLET | ORAL | 0 refills | Status: DC

## 2016-03-26 NOTE — Addendum Note (Signed)
Addended by: [name] on: 03/26/2016 11:34 PM   Modules accepted: Orders

## 2016-03-28 DIAGNOSIS — Z86010 Personal history of colonic polyps: Secondary | ICD-10-CM | POA: Diagnosis not present

## 2016-03-29 DIAGNOSIS — M119 Crystal arthropathy, unspecified: Secondary | ICD-10-CM | POA: Diagnosis not present

## 2016-03-29 DIAGNOSIS — M7989 Other specified soft tissue disorders: Secondary | ICD-10-CM | POA: Diagnosis not present

## 2016-03-29 DIAGNOSIS — M25432 Effusion, left wrist: Secondary | ICD-10-CM | POA: Diagnosis not present

## 2016-03-29 DIAGNOSIS — M25532 Pain in left wrist: Secondary | ICD-10-CM | POA: Diagnosis not present

## 2016-03-29 DIAGNOSIS — I10 Essential (primary) hypertension: Secondary | ICD-10-CM | POA: Diagnosis not present

## 2016-03-29 DIAGNOSIS — G8929 Other chronic pain: Secondary | ICD-10-CM | POA: Diagnosis not present

## 2016-04-06 DIAGNOSIS — M6580 Other synovitis and tenosynovitis, unspecified site: Secondary | ICD-10-CM | POA: Diagnosis not present

## 2016-04-06 DIAGNOSIS — M25432 Effusion, left wrist: Secondary | ICD-10-CM | POA: Diagnosis not present

## 2016-04-06 DIAGNOSIS — M119 Crystal arthropathy, unspecified: Secondary | ICD-10-CM | POA: Diagnosis not present

## 2016-04-13 IMAGING — CT CT CHEST LUNG CANCER SCREENING LOW DOSE W/O CM
2 of 4 series · 15 of 36 positions shown, 18 images · non-contrast
Comparison: None.

CLINICAL DATA: Lung cancer screening. 68-year-old with 56 pack-year
smoking history. Currently smokes [DATE] pack per day. Hypertension.

EXAM:
CT CHEST WITHOUT CONTRAST
TECHNIQUE: Multidetector CT imaging of the chest was performed following the
standard protocol without IV contrast..

[Series 5: lungs thins for pacs · axial · 0.71mm/px · z∈[-290,-17]mm · 12 of 376 slices shown, 15 images]
[im 17/376  mediastinal]
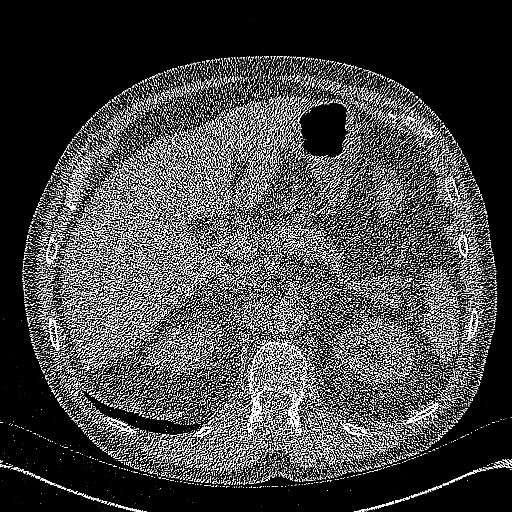
[im 17/376  lung]
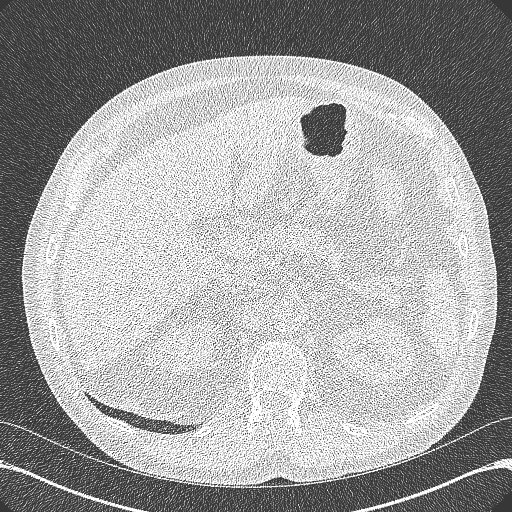
[im 49/376  lung]
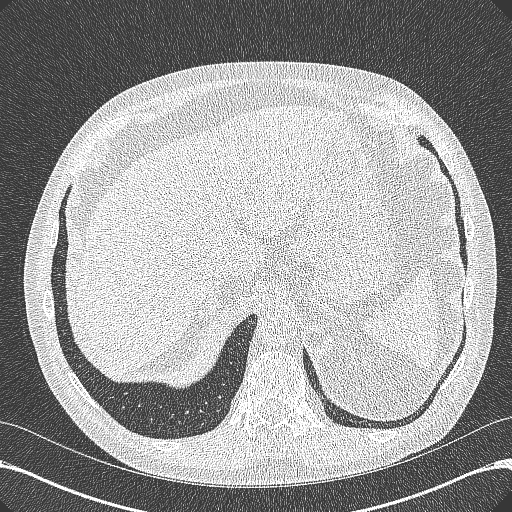
[im 82/376  lung]
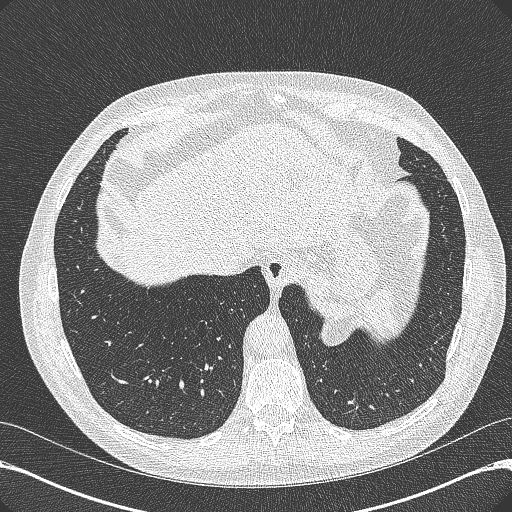
[im 115/376  lung]
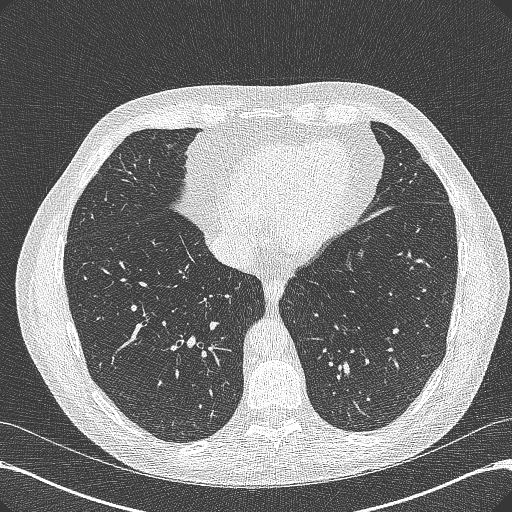
[im 147/376  mediastinal]
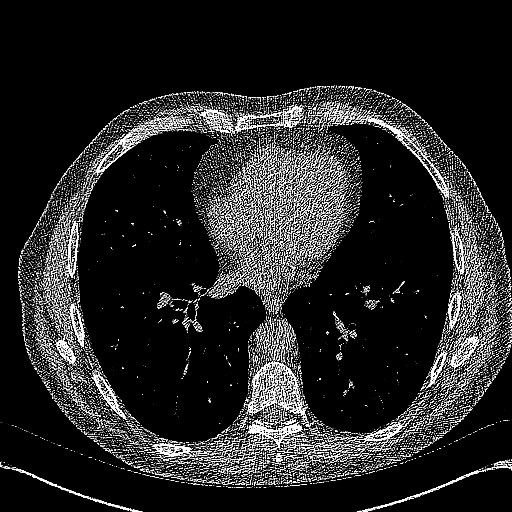
[im 147/376  lung]
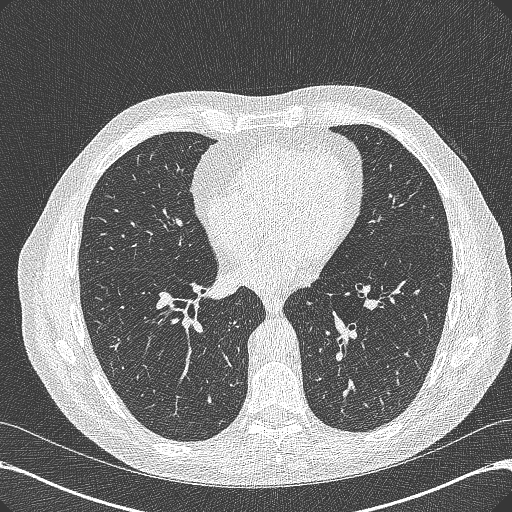
[im 180/376  lung]
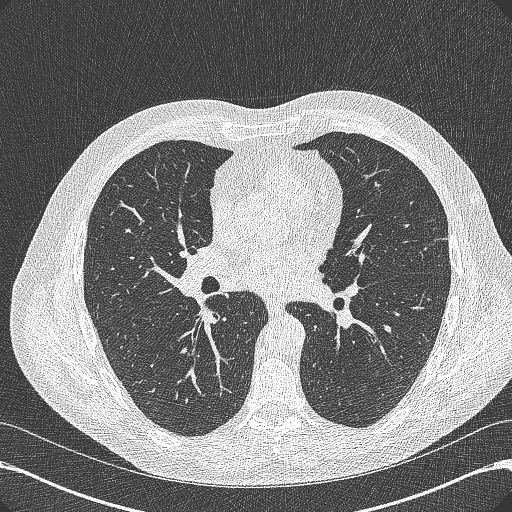
[im 196/376  lung]
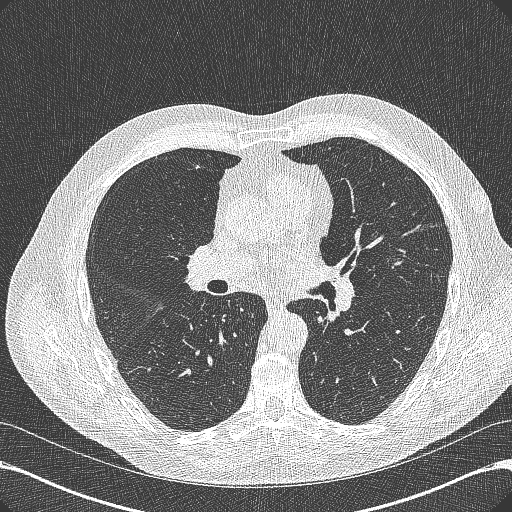
[im 229/376  lung]
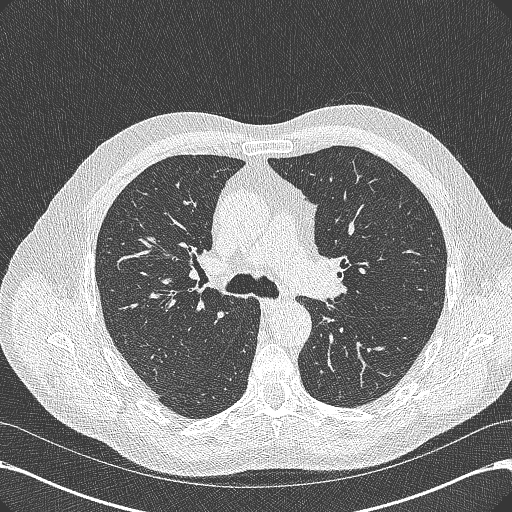
[im 261/376  mediastinal]
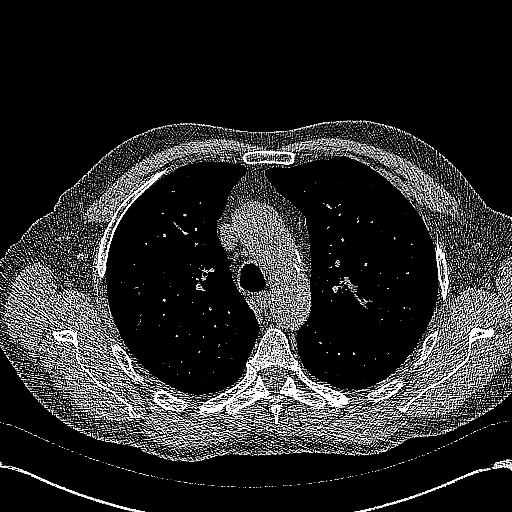
[im 261/376  lung]
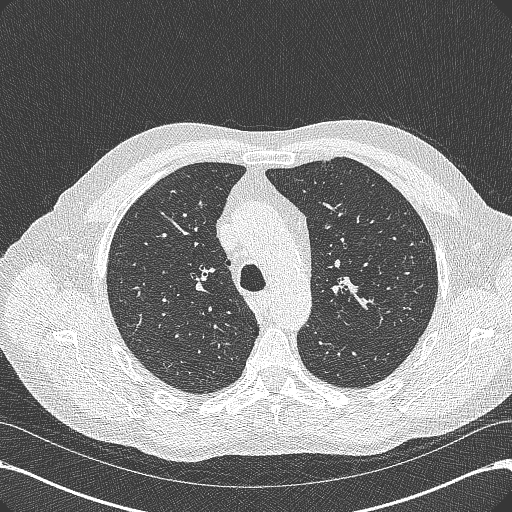
[im 294/376  lung]
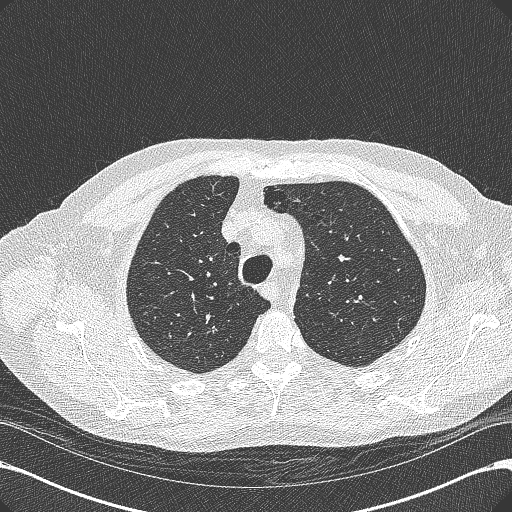
[im 327/376  lung]
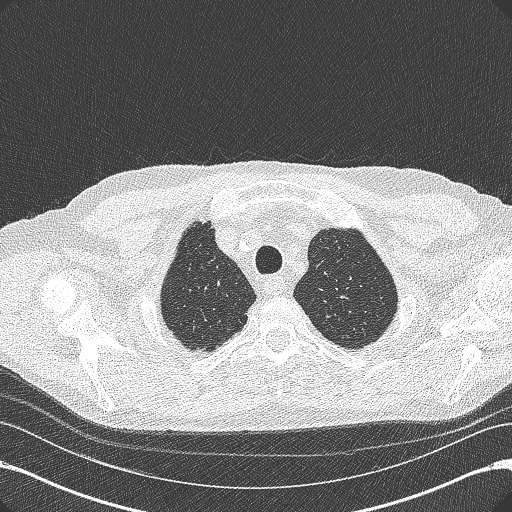
[im 359/376  lung]
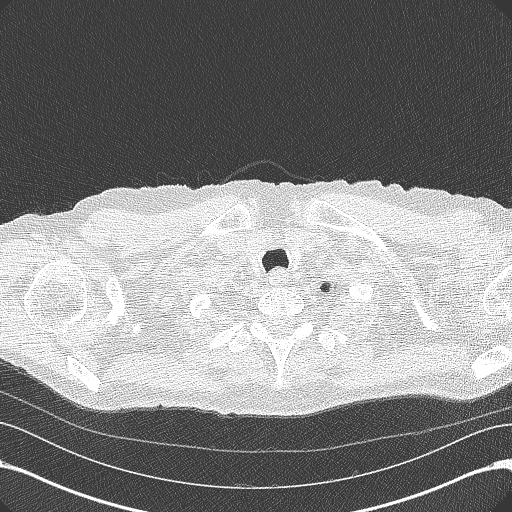

[Series 602: cor · coronal · 0.71mm/px · 3 of 123 slices shown]
[im 25/123  lung]
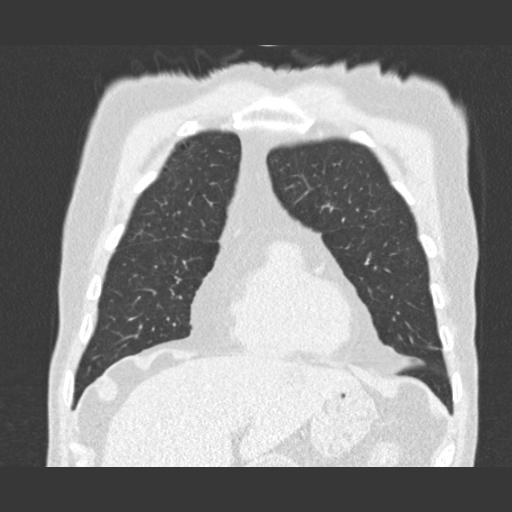
[im 49/123  lung]
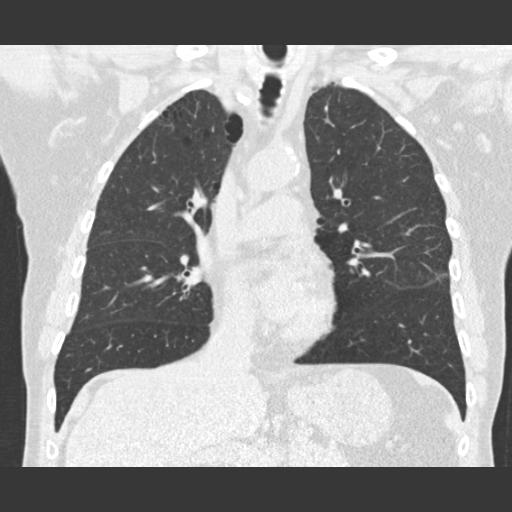
[im 74/123  lung]
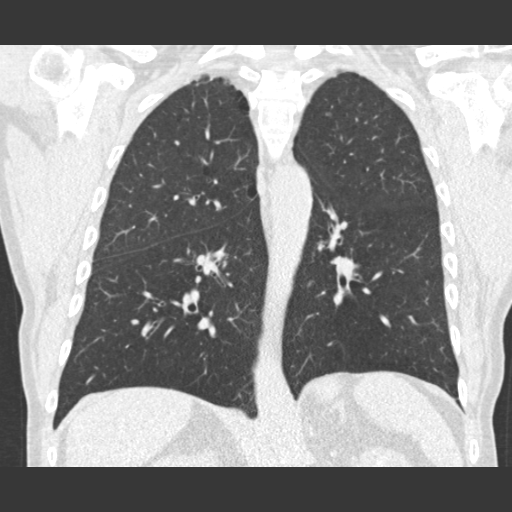

[15 of 36 positions shown; findings below may reference images not displayed]

FINDINGS: Mediastinum/Nodes: Bovine arch. Aortic and branch vessel
atherosclerosis. Normal heart size, without pericardial effusion.
Coronary artery atherosclerosis, including within the LAD on image
30. No mediastinal or definite hilar adenopathy, given limitations
of unenhanced CT.

Lungs/Pleura: No pleural fluid. Mild to moderate paraseptal and
centrilobular emphysema. Left upper lobe 6 mm pulmonary nodule,
including on image 113 of series 5.

Upper abdomen: Low-density left liver lobe lesions. Example 1.4 cm
on image 47. Fluid density. Smaller more inferior left liver lobe
lesions are too small to entirely characterize. Cholecystectomy.
Normal imaged portions of the spleen, stomach, pancreas, adrenal
glands.

Interpolar right renal lesion is fluid density and 1.7 cm on image
60. Minimal calcification in its wall. An adjacent smaller right
renal lesion is also low-density. favor artifactual hyper
attenuation within the left kidney on image 60.

Musculoskeletal: Probable sebaceous cyst about the left lateral
chest wall, just inferior and lateral the left nipple. Example image
30. No acute osseous abnormality.
IMPRESSION: 1. Lung-RADS Category 2, benign appearance or behavior. Continue
annual screening with low-dose chest CT without contrast in 12
months. 6 mm left upper lobe pulmonary nodule.
2. Centrilobular and paraseptal emphysema.
3.  Atherosclerosis, including within the coronary arteries.
4. Probable hepatic cysts. Suboptimally evaluated on this
precontrast CT.
5. Right renal lesions which are likely cysts. Calcification within
an interpolar right renal lesion suggests complexity. Consider
further evaluation with renal ultrasound.
6. Probable sebaceous cyst about the lateral left chest wall.
Consider physical exam correlation.

## 2016-04-28 ENCOUNTER — Other Ambulatory Visit: Payer: Self-pay | Admitting: Family Medicine

## 2016-05-10 ENCOUNTER — Encounter: Payer: Self-pay | Admitting: *Deleted

## 2016-05-11 ENCOUNTER — Ambulatory Visit: Payer: Medicare Other | Admitting: Anesthesiology

## 2016-05-11 ENCOUNTER — Ambulatory Visit
Admission: RE | Admit: 2016-05-11 | Discharge: 2016-05-11 | Disposition: A | Discharge status: Home / Self Care | Payer: Medicare Other | Source: Ambulatory Visit | Attending: Unknown Physician Specialty | Admitting: Unknown Physician Specialty

## 2016-05-11 ENCOUNTER — Encounter
Admission: RE | Disposition: A | Discharge status: Home / Self Care | Payer: Self-pay | Source: Ambulatory Visit | Attending: Unknown Physician Specialty

## 2016-05-11 DIAGNOSIS — K635 Polyp of colon: Secondary | ICD-10-CM | POA: Insufficient documentation

## 2016-05-11 DIAGNOSIS — K760 Fatty (change of) liver, not elsewhere classified: Secondary | ICD-10-CM | POA: Diagnosis not present

## 2016-05-11 DIAGNOSIS — K648 Other hemorrhoids: Secondary | ICD-10-CM | POA: Diagnosis not present

## 2016-05-11 DIAGNOSIS — D122 Benign neoplasm of ascending colon: Secondary | ICD-10-CM | POA: Diagnosis not present

## 2016-05-11 DIAGNOSIS — I10 Essential (primary) hypertension: Secondary | ICD-10-CM | POA: Diagnosis not present

## 2016-05-11 DIAGNOSIS — Z8261 Family history of arthritis: Secondary | ICD-10-CM | POA: Diagnosis not present

## 2016-05-11 DIAGNOSIS — Z9889 Other specified postprocedural states: Secondary | ICD-10-CM | POA: Diagnosis not present

## 2016-05-11 DIAGNOSIS — Z86010 Personal history of colonic polyps: Secondary | ICD-10-CM | POA: Diagnosis not present

## 2016-05-11 DIAGNOSIS — F17210 Nicotine dependence, cigarettes, uncomplicated: Secondary | ICD-10-CM | POA: Insufficient documentation

## 2016-05-11 DIAGNOSIS — Z9049 Acquired absence of other specified parts of digestive tract: Secondary | ICD-10-CM | POA: Insufficient documentation

## 2016-05-11 DIAGNOSIS — I2510 Atherosclerotic heart disease of native coronary artery without angina pectoris: Secondary | ICD-10-CM | POA: Insufficient documentation

## 2016-05-11 DIAGNOSIS — Z801 Family history of malignant neoplasm of trachea, bronchus and lung: Secondary | ICD-10-CM | POA: Insufficient documentation

## 2016-05-11 DIAGNOSIS — K7689 Other specified diseases of liver: Secondary | ICD-10-CM | POA: Diagnosis not present

## 2016-05-11 DIAGNOSIS — Z87442 Personal history of urinary calculi: Secondary | ICD-10-CM | POA: Insufficient documentation

## 2016-05-11 DIAGNOSIS — D123 Benign neoplasm of transverse colon: Secondary | ICD-10-CM | POA: Insufficient documentation

## 2016-05-11 DIAGNOSIS — D128 Benign neoplasm of rectum: Secondary | ICD-10-CM | POA: Diagnosis not present

## 2016-05-11 DIAGNOSIS — M1990 Unspecified osteoarthritis, unspecified site: Secondary | ICD-10-CM | POA: Diagnosis not present

## 2016-05-11 DIAGNOSIS — D125 Benign neoplasm of sigmoid colon: Secondary | ICD-10-CM | POA: Insufficient documentation

## 2016-05-11 DIAGNOSIS — E785 Hyperlipidemia, unspecified: Secondary | ICD-10-CM | POA: Insufficient documentation

## 2016-05-11 DIAGNOSIS — Z1211 Encounter for screening for malignant neoplasm of colon: Secondary | ICD-10-CM | POA: Insufficient documentation

## 2016-05-11 DIAGNOSIS — I714 Abdominal aortic aneurysm, without rupture: Secondary | ICD-10-CM | POA: Diagnosis not present

## 2016-05-11 DIAGNOSIS — J439 Emphysema, unspecified: Secondary | ICD-10-CM | POA: Insufficient documentation

## 2016-05-11 HISTORY — DX: Essential (primary) hypertension: I10

## 2016-05-11 HISTORY — DX: Hyperlipidemia, unspecified: E78.5

## 2016-05-11 HISTORY — PX: COLONOSCOPY: SHX5424

## 2016-05-11 SURGERY — COLONOSCOPY
Anesthesia: General

## 2016-05-11 MED ORDER — SODIUM CHLORIDE 0.9 % IV SOLN
INTRAVENOUS | Status: DC
  Administered 2016-05-11: 13:00:00 via INTRAVENOUS

## 2016-05-11 MED ORDER — PROPOFOL 500 MG/50ML IV EMUL
INTRAVENOUS | Status: DC | PRN
  Administered 2016-05-11: 100 ug/kg/min via INTRAVENOUS

## 2016-05-11 MED ORDER — FENTANYL CITRATE (PF) 100 MCG/2ML IJ SOLN
INTRAMUSCULAR | Status: DC | PRN
  Administered 2016-05-11: 50 ug via INTRAVENOUS

## 2016-05-11 MED ORDER — MIDAZOLAM HCL 2 MG/2ML IJ SOLN
INTRAMUSCULAR | Status: DC | PRN
  Administered 2016-05-11: 1 mg via INTRAVENOUS

## 2016-05-11 NOTE — Anesthesia Procedure Notes (Signed)
Performed by: COOK-MARTIN, Chanya Chrisley Pre-anesthesia Checklist: Patient identified, Emergency Drugs available, Suction available, Patient being monitored and Timeout performed Patient Re-evaluated:Patient Re-evaluated prior to inductionOxygen Delivery Method: Nasal cannula Preoxygenation: Pre-oxygenation with 100% oxygen Intubation Type: IV induction Placement Confirmation: positive ETCO2 and CO2 detector       

## 2016-05-11 NOTE — Anesthesia Preprocedure Evaluation (Signed)
Anesthesia Evaluation  Patient identified by MRN, date of birth, ID band Patient awake    Reviewed: Allergy & Precautions, NPO status , Patient's Chart, lab work & pertinent test results  History of Anesthesia Complications Negative for: history of anesthetic complications  Airway Mallampati: II       Dental  (+) Upper Dentures, Lower Dentures   Pulmonary COPD, Current Smoker,           Cardiovascular hypertension, Pt. on medications + CAD and + Peripheral Vascular Disease       Neuro/Psych negative neurological ROS     GI/Hepatic negative GI ROS, Neg liver ROS,   Endo/Other  negative endocrine ROS  Renal/GU Renal disease (renal cysts)     Musculoskeletal   Abdominal   Peds  Hematology negative hematology ROS (+)   Anesthesia Other Findings   Reproductive/Obstetrics                             Anesthesia Physical Anesthesia Plan  ASA: III  Anesthesia Plan: General   Post-op Pain Management:    Induction: Intravenous  Airway Management Planned: Nasal Cannula  Additional Equipment:   Intra-op Plan:   Post-operative Plan:   Informed Consent: I have reviewed the patients History and Physical, chart, labs and discussed the procedure including the risks, benefits and alternatives for the proposed anesthesia with the patient or authorized representative who has indicated his/her understanding and acceptance.     Plan Discussed with:   Anesthesia Plan Comments:         Anesthesia Quick Evaluation

## 2016-05-11 NOTE — Op Note (Signed)
[facility] Patient Name: Gregory Colon Procedure Date: 05/11/2016 1:40 PM MRN: [id number] Account #: [id number] Date of Birth: Dec 20, 1945 Admit Type: Outpatient Age: 70 Room: [facility] ENDO ROOM 1 Gender: Male Note Status: Finalized Procedure:            Colonoscopy Indications:          Heme positive stool Providers:            Manya Silvas, MD Referring MD:         [name] (Referring MD) Medicines:            Propofol per Anesthesia Complications:        No immediate complications. Procedure:            Pre-Anesthesia Assessment:                       - After reviewing the risks and benefits, the patient                        was deemed in satisfactory condition to undergo the                        procedure.                       After obtaining informed consent, the colonoscope was                        passed under direct vision. Throughout the procedure,                        the patient's blood pressure, pulse, and oxygen                        saturations were monitored continuously. The                        Colonoscope was introduced through the anus and                        advanced to the the cecum, identified by appendiceal                        orifice and ileocecal valve. The colonoscopy was                        performed without difficulty. The patient tolerated the                        procedure well. The quality of the bowel preparation                        was good. Findings:      A small polyp was found in the transverse colon. The polyp was sessile.       The polyp was removed with a hot snare. Resection and retrieval were       complete.      Two sessile polyps were found in the rectum and sigmoid colon. The       polyps were small in size. These polyps were removed with a hot snare.  Resection and retrieval were complete.      Two sessile polyps were found in the transverse colon and  ascending       colon. The polyps were diminutive in size. These polyps were removed       with a jumbo cold forceps. Resection and retrieval were complete.      The exam was otherwise without abnormality. Impression:           - One small polyp in the transverse colon, removed with                        a hot snare. Resected and retrieved.                       - Two small polyps in the rectum and in the sigmoid                        colon, removed with a hot snare. Resected and retrieved.                       - Two diminutive polyps in the transverse colon and in                        the ascending colon, removed with a jumbo cold forceps.                        Resected and retrieved.                       - The examination was otherwise normal. Recommendation:       - Await pathology results. Manya Silvas, MD 05/11/2016 2:21:36 PM This report has been signed electronically. Number of Addenda: 0 Note Initiated On: 05/11/2016 1:40 PM Scope Withdrawal Time: 0 hours 9 minutes 6 seconds  Total Procedure Duration: 0 hours 23 minutes 9 seconds       [facility]

## 2016-05-11 NOTE — Transfer of Care (Signed)
Immediate Anesthesia Transfer of Care Note  Patient: Gregory Colon  Procedure(s) Performed: Procedure(s): COLONOSCOPY (N/A)  Patient Location: PACU  Anesthesia Type:General  Level of Consciousness: awake and alert   Airway & Oxygen Therapy: Patient Spontanous Breathing and Patient connected to nasal cannula oxygen  Post-op Assessment: Report given to RN and Post -op Vital signs reviewed and stable  Post vital signs: Reviewed and stable  Last Vitals:  Vitals:   05/11/16 1250  BP: 130/79  Pulse: 88  Resp: 18  Temp: 36.7 C    Last Pain:  Vitals:   05/11/16 1250  TempSrc: Oral         Complications: No apparent anesthesia complications

## 2016-05-11 NOTE — Anesthesia Postprocedure Evaluation (Signed)
Anesthesia Post Note  Patient: Gregory Colon  Procedure(s) Performed: Procedure(s) (LRB): COLONOSCOPY (N/A)  Patient location during evaluation: PACU Anesthesia Type: General Level of consciousness: awake Pain management: pain level controlled Vital Signs Assessment: post-procedure vital signs reviewed and stable Respiratory status: spontaneous breathing Cardiovascular status: stable Anesthetic complications: no    Last Vitals:  Vitals:   05/11/16 1250 05/11/16 1425  BP: 130/79 (!) 86/62  Pulse: 88 75  Resp: 18 16  Temp: 36.7 C (!) 36 C    Last Pain:  Vitals:   05/11/16 1425  TempSrc: Tympanic                 VAN STAVEREN,Duan Scharnhorst

## 2016-05-11 NOTE — H&P (Signed)
Primary Care Physician:  [name], MD Primary Gastroenterologist:  Dr. Vira Agar  Pre-Procedure History & Physical: HPI:  Gregory Colon is a 69 y.o. male is here for an colonoscopy.   Past Medical History:  Diagnosis Date  . AAA (abdominal aortic aneurysm) ([place]) 11/2014   by MRI 3cm  . Arthritis    R hand  . CAD (coronary artery disease) 10/2014   by CT  . Emphysema of lung ([place]) 10/2014   by CT  . Fatty liver 11/2014   by [place]  . History of chicken pox   . History of kidney stones 1966  . Hyperlipidemia   . Hypertension   . Kidney cysts 11/2014   R>L  . Liver cyst    multiple  . Smoker     Past Surgical History:  Procedure Laterality Date  . CHOLECYSTECTOMY  2000  . COLONOSCOPY N/A 01/08/2015   mult TAs Manya Silvas, MD)  . ESOPHAGOGASTRODUODENOSCOPY N/A 01/08/2015   gastritis, HH Manya Silvas, MD)    Prior to Admission medications   Medication Sig Start Date End Date Taking? Authorizing Provider  colchicine 0.6 MG tablet Take 0.6 mg by mouth daily.   Yes Historical Provider, MD  hydrochlorothiazide (HYDRODIURIL) 25 MG tablet TAKE 1 TABLET (25 MG TOTAL) BY MOUTH DAILY. 04/28/16  Yes [name], MD  Multiple Vitamins-Minerals (MULTIVITAMIN ADULTS 50+ PO) Take 1 tablet by mouth daily.   Yes Historical Provider, MD  atorvastatin (LIPITOR) 40 MG tablet Take 1 tablet (40 mg total) by mouth daily. 03/02/16   [name], MD  naproxen (NAPROSYN) 500 MG tablet Take one po bid x 1 week then prn pain, take with food Patient not taking: Reported on 05/11/2016 03/26/16   [name], MD    Allergies as of 04/28/2016  . (No Known Allergies)    Family History  Problem Relation Age of Onset  . Arthritis Mother   . Cancer Brother     lung  . Hypertension Neg Hx   . CAD Neg Hx   . Stroke Neg Hx   . Diabetes Neg Hx     Social History   Social History  . Marital status: Single    Spouse name: N/A  . Number of children: N/A  . Years of  education: N/A   Occupational History  . Not on file.   Social History Main Topics  . Smoking status: Current Every Day Smoker    Packs/day: 0.25    Types: Cigarettes    Start date: 08/28/1964  . Smokeless tobacco: Never Used     Comment: 30+ PY  . Alcohol use No  . Drug use: No  . Sexual activity: Not on file   Other Topics Concern  . Not on file   Social History Narrative   Lives alone, divorced, father of Gregory Colon   Occupation: retired, was [employer]   Activity: yardwork - cutting wood   Diet: some water, fruits/vegetables some    Review of Systems: See HPI, otherwise negative ROS  Physical Exam: BP 130/79   Pulse 88   Temp 98.1 F (36.7 C) (Oral)   Resp 18   Ht 5\' 7"  (1.702 m)   Wt 74.8 kg (165 lb)   SpO2 99%   BMI 25.84 kg/m  General:   Alert,  pleasant and cooperative in NAD Head:  Normocephalic and atraumatic. Neck:  Supple; no masses or thyromegaly. Lungs:  Clear throughout to auscultation.  Heart:  Regular rate and rhythm. Abdomen:  Soft, nontender and nondistended. Normal bowel sounds, without guarding, and without rebound.   Neurologic:  Alert and  oriented x4;  grossly normal neurologically.  Impression/Plan: [name] is here for an colonoscopy to be performed for [facility] colon polyps  Risks, benefits, limitations, and alternatives regarding  colonoscopy have been reviewed with the patient.  Questions have been answered.  All parties agreeable.   Gaylyn Cheers, MD  05/11/2016, 1:37 PM

## 2016-05-15 ENCOUNTER — Encounter: Payer: Self-pay | Admitting: Family Medicine

## 2016-05-15 LAB — SURGICAL PATHOLOGY

## 2016-05-25 ENCOUNTER — Encounter: Payer: Self-pay | Admitting: Family Medicine

## 2016-06-05 ENCOUNTER — Other Ambulatory Visit: Payer: Self-pay | Admitting: Family Medicine

## 2016-06-05 DIAGNOSIS — E785 Hyperlipidemia, unspecified: Secondary | ICD-10-CM

## 2016-06-05 DIAGNOSIS — R739 Hyperglycemia, unspecified: Secondary | ICD-10-CM

## 2016-06-06 ENCOUNTER — Other Ambulatory Visit (INDEPENDENT_AMBULATORY_CARE_PROVIDER_SITE_OTHER): Payer: Medicare Other

## 2016-06-06 DIAGNOSIS — E785 Hyperlipidemia, unspecified: Secondary | ICD-10-CM

## 2016-06-06 DIAGNOSIS — R739 Hyperglycemia, unspecified: Secondary | ICD-10-CM | POA: Diagnosis not present

## 2016-06-06 LAB — COMPREHENSIVE METABOLIC PANEL
ALK PHOS: 90 U/L (ref 39–117)
ALT: 17 U/L (ref 0–53)
AST: 16 U/L (ref 0–37)
Albumin: 3.7 g/dL (ref 3.5–5.2)
BILIRUBIN TOTAL: 0.5 mg/dL (ref 0.2–1.2)
BUN: 12 mg/dL (ref 6–23)
CALCIUM: 9.8 mg/dL (ref 8.4–10.5)
CO2: 29 mEq/L (ref 19–32)
Chloride: 101 mEq/L (ref 96–112)
Creatinine, Ser: 0.98 mg/dL (ref 0.40–1.50)
GFR: 80.12 mL/min (ref >60.00)
Glucose, Bld: 128 mg/dL — ABNORMAL HIGH (ref 70–99)
Potassium: 3.6 mEq/L (ref 3.5–5.1)
Sodium: 139 mEq/L (ref 135–145)
TOTAL PROTEIN: 7 g/dL (ref 6.0–8.3)

## 2016-06-06 LAB — LIPID PANEL
CHOL/HDL RATIO: 4
Cholesterol: 133 mg/dL (ref 0–200)
HDL: 33.3 mg/dL — AB (ref >39.00)
LDL Cholesterol: 81 mg/dL (ref 0–99)
NonHDL: 99.41
TRIGLYCERIDES: 94 mg/dL (ref 0.0–149.0)
VLDL: 18.8 mg/dL (ref 0.0–40.0)

## 2016-06-06 LAB — HEMOGLOBIN A1C: Hgb A1c MFr Bld: 6 % (ref 4.6–6.5)

## 2016-06-10 ENCOUNTER — Encounter: Payer: Self-pay | Admitting: Family Medicine

## 2016-06-19 ENCOUNTER — Encounter: Payer: Self-pay | Admitting: Family Medicine

## 2016-06-20 DIAGNOSIS — Z23 Encounter for immunization: Secondary | ICD-10-CM | POA: Diagnosis not present

## 2016-06-22 DIAGNOSIS — M119 Crystal arthropathy, unspecified: Secondary | ICD-10-CM | POA: Diagnosis not present

## 2016-06-22 DIAGNOSIS — G8929 Other chronic pain: Secondary | ICD-10-CM | POA: Diagnosis not present

## 2016-06-22 DIAGNOSIS — M6580 Other synovitis and tenosynovitis, unspecified site: Secondary | ICD-10-CM | POA: Diagnosis not present

## 2016-06-22 DIAGNOSIS — M12812 Other specific arthropathies, not elsewhere classified, left shoulder: Secondary | ICD-10-CM | POA: Diagnosis not present

## 2016-06-22 DIAGNOSIS — M25512 Pain in left shoulder: Secondary | ICD-10-CM | POA: Diagnosis not present

## 2016-08-10 DIAGNOSIS — M25432 Effusion, left wrist: Secondary | ICD-10-CM | POA: Diagnosis not present

## 2016-08-10 DIAGNOSIS — M119 Crystal arthropathy, unspecified: Secondary | ICD-10-CM | POA: Diagnosis not present

## 2016-08-10 DIAGNOSIS — M6580 Other synovitis and tenosynovitis, unspecified site: Secondary | ICD-10-CM | POA: Diagnosis not present

## 2016-12-05 DIAGNOSIS — M25432 Effusion, left wrist: Secondary | ICD-10-CM | POA: Diagnosis not present

## 2016-12-18 DIAGNOSIS — M25432 Effusion, left wrist: Secondary | ICD-10-CM | POA: Diagnosis not present

## 2016-12-18 DIAGNOSIS — M6580 Other synovitis and tenosynovitis, unspecified site: Secondary | ICD-10-CM | POA: Diagnosis not present

## 2016-12-18 DIAGNOSIS — M119 Crystal arthropathy, unspecified: Secondary | ICD-10-CM | POA: Diagnosis not present

## 2017-01-27 ENCOUNTER — Other Ambulatory Visit: Payer: Self-pay | Admitting: Family Medicine

## 2017-01-29 NOTE — Telephone Encounter (Signed)
Gregory Colon (DPR signed) request status of HCTZ refill; I spoke with [name] at [facility] and pt has one refill left from 04/28/16 rx. Disregard refill request sent on 01/27/17 and [name] will get HCTZ ready for pick up. [place] notified and voiced understanding.

## 2017-02-27 NOTE — Progress Notes (Signed)
Subjective:   Gregory Colon is a 71 y.o. male who presents for Medicare Annual/Subsequent preventive examination.  Review of Systems:  No ROS.  Medicare Wellness Visit. Additional risk factors are reflected in the social history.  Cardiac Risk Factors include: advanced age (>70men, >30 women);dyslipidemia;hypertension;male gender;smoking/ tobacco exposure     Objective:    Vitals: BP 120/80   Pulse 72   Ht 5' 7.5" (1.715 m)   Wt 164 lb 8 oz (74.6 kg)   SpO2 98%   BMI 25.38 kg/m   Body mass index is 25.38 kg/m.  Tobacco History  Smoking Status  . Current Every Day Smoker  . Packs/day: 0.25  . Types: Cigarettes  . Start date: 08/28/1964  Smokeless Tobacco  . Never Used    Comment: 30+ PY     Ready to quit: Yes Counseling given: Yes   Past Medical History:  Diagnosis Date  . AAA (abdominal aortic aneurysm) ([place]) 11/2014   by MRI 3cm  . Arthritis    R hand  . CAD (coronary artery disease) 10/2014   by CT  . Emphysema of lung ([place]) 10/2014   by CT  . Fatty liver 11/2014   by [place]  . History of chicken pox   . History of kidney stones 1966  . Hyperlipidemia   . Hypertension   . Kidney cysts 11/2014   R>L  . Liver cyst    multiple  . Smoker    Past Surgical History:  Procedure Laterality Date  . CHOLECYSTECTOMY  2000  . COLONOSCOPY N/A 01/08/2015   mult TAs Manya Silvas, MD)  . COLONOSCOPY N/A 05/11/2016   TAs and SSA, rpt 3 yrs (Dr Manya Silvas)  . ESOPHAGOGASTRODUODENOSCOPY N/A 01/08/2015   gastritis, HH Manya Silvas, MD)   Family History  Problem Relation Age of Onset  . Arthritis Mother   . Cancer Brother        lung  . Hypertension Neg Hx   . CAD Neg Hx   . Stroke Neg Hx   . Diabetes Neg Hx    History  Sexual Activity  . Sexual activity: No    Outpatient Encounter Prescriptions as of 03/08/2017  Medication Sig  . atorvastatin (LIPITOR) 40 MG tablet Take 1 tablet (40 mg total) by mouth daily.  . colchicine 0.6 MG tablet Take  0.6 mg by mouth daily.  . hydrochlorothiazide (HYDRODIURIL) 25 MG tablet TAKE 1 TABLET (25 MG TOTAL) BY MOUTH DAILY.  . Multiple Vitamins-Minerals (MULTIVITAMIN ADULTS 50+ PO) Take 1 tablet by mouth daily.  . [DISCONTINUED] naproxen (NAPROSYN) 500 MG tablet Take one po bid x 1 week then prn pain, take with food (Patient not taking: Reported on 03/08/2017)   No facility-administered encounter medications on file as of 03/08/2017.     Activities of Daily Living In your present state of health, do you have any difficulty performing the following activities: 03/08/2017  Hearing? N  Vision? N  Difficulty concentrating or making decisions? N  Walking or climbing stairs? N  Dressing or bathing? N  Doing errands, shopping? N  Preparing Food and eating ? N  Using the Toilet? N  In the past six months, have you accidently leaked urine? N  Do you have problems with loss of bowel control? N  Managing your Medications? N  Managing your Finances? N  Housekeeping or managing your Housekeeping? N  Some recent data might be hidden    Patient Care Team: [name], MD  as PCP - General (Family Medicine) Marlowe Sax, MD (Rheumatology)   Assessment:    Physical assessment deferred to PCP.  Exercise Activities and Dietary recommendations Current Exercise Habits: Home exercise routine, Type of exercise: walking;Other - see comments (gardening, lawn mowing), Time (Minutes): 45, Frequency (Times/Week): 7, Weekly Exercise (Minutes/Week): 315  Goals    . Quit smoking / using tobacco (pt-stated)          Starting 03/08/2017, I will work towards quitting smoking completely.      Fall Risk Fall Risk  03/08/2017 03/02/2016 11/22/2015 11/11/2014  Falls in the past year? No No No No   Depression Screen PHQ 2/9 Scores 03/08/2017 03/02/2016 11/22/2015 11/11/2014  PHQ - 2 Score 0 0 0 0    Cognitive Function PLEASE NOTE: A Mini-Cog screen was completed. Maximum score is 20. A value of 0 denotes  this part of Folstein MMSE was not completed or the patient failed this part of the Mini-Cog screening.   Mini-Cog Screening Orientation to Time - Max 5 pts Orientation to Place - Max 5 pts Registration - Max 3 pts Recall - Max 3 pts Language Repeat - Max 1 pts Language Follow 3 Step Command - Max 3 pts      Mini-Cog - 03/08/17 0902    Normal clock drawing test? yes   How many words correct? 2      MMSE - Mini Mental State Exam 03/08/2017  Orientation to time 5  Orientation to Place 5  Registration 3  Attention/ Calculation 0  Recall 2  Language- name 2 objects 0  Language- repeat 1  Language- follow 3 step command 3  Language- read & follow direction 0  Write a sentence 0  Copy design 0  Total score 19        Immunization History  Administered Date(s) Administered  . Influenza, High Dose Seasonal PF 06/20/2016  . Influenza-Unspecified 06/28/2014  . Pneumococcal Conjugate-13 08/07/2014  . Pneumococcal Polysaccharide-23 03/02/2016   Screening Tests Health Maintenance  Topic Date Due  . DTaP/Tdap/Td (1 - Tdap) 05/15/1964  . TETANUS/TDAP  05/15/1964  . INFLUENZA VACCINE  03/28/2017  . COLONOSCOPY  05/12/2019  . Hepatitis C Screening  Completed  . PNA vac Low Risk Adult  Completed      Plan:    Follow-up w/ PCP as scheduled.  I have personally reviewed and noted the following in the patient's chart:   . Medical and social history . Use of alcohol, tobacco or illicit drugs  . Current medications and supplements . Functional ability and status . Nutritional status . Physical activity . Advanced directives . List of other physicians . Vitals . Screenings to include cognitive, depression, and falls . Referrals and appointments  In addition, I have reviewed and discussed with patient certain preventive protocols, quality metrics, and best practice recommendations. A written personalized care plan for preventive services as well as general preventive health  recommendations were provided to patient.     Dorrene German, RN  03/08/2017

## 2017-03-01 NOTE — Progress Notes (Signed)
PCP notes:   Health maintenance: Tdap-patient to check w/ insurance regarding coverage.   Abnormal screenings:  Hearing-failed.   Patient concerns: None   Nurse concerns: Patient is contemplating quitting smoking. Applauded his efforts and printed smoking cessation information.    Next PCP appt: 03/14/2017 @ 7:30am

## 2017-03-07 ENCOUNTER — Other Ambulatory Visit: Payer: Self-pay | Admitting: Family Medicine

## 2017-03-07 DIAGNOSIS — Z125 Encounter for screening for malignant neoplasm of prostate: Secondary | ICD-10-CM

## 2017-03-07 DIAGNOSIS — K760 Fatty (change of) liver, not elsewhere classified: Secondary | ICD-10-CM

## 2017-03-07 DIAGNOSIS — R7303 Prediabetes: Secondary | ICD-10-CM

## 2017-03-07 DIAGNOSIS — M6580 Other synovitis and tenosynovitis, unspecified site: Principal | ICD-10-CM

## 2017-03-07 DIAGNOSIS — M119 Crystal arthropathy, unspecified: Secondary | ICD-10-CM

## 2017-03-07 DIAGNOSIS — E786 Lipoprotein deficiency: Secondary | ICD-10-CM

## 2017-03-08 ENCOUNTER — Ambulatory Visit (INDEPENDENT_AMBULATORY_CARE_PROVIDER_SITE_OTHER): Payer: Medicare Other

## 2017-03-08 ENCOUNTER — Other Ambulatory Visit (INDEPENDENT_AMBULATORY_CARE_PROVIDER_SITE_OTHER): Payer: Medicare Other

## 2017-03-08 VITALS — BP 120/80 | HR 72 | Ht 67.5 in | Wt 164.5 lb

## 2017-03-08 DIAGNOSIS — Z0000 Encounter for general adult medical examination without abnormal findings: Secondary | ICD-10-CM

## 2017-03-08 DIAGNOSIS — K760 Fatty (change of) liver, not elsewhere classified: Secondary | ICD-10-CM | POA: Diagnosis not present

## 2017-03-08 DIAGNOSIS — R7303 Prediabetes: Secondary | ICD-10-CM | POA: Diagnosis not present

## 2017-03-08 DIAGNOSIS — M6580 Other synovitis and tenosynovitis, unspecified site: Secondary | ICD-10-CM | POA: Diagnosis not present

## 2017-03-08 DIAGNOSIS — E786 Lipoprotein deficiency: Secondary | ICD-10-CM | POA: Diagnosis not present

## 2017-03-08 DIAGNOSIS — Z125 Encounter for screening for malignant neoplasm of prostate: Secondary | ICD-10-CM

## 2017-03-08 DIAGNOSIS — M119 Crystal arthropathy, unspecified: Secondary | ICD-10-CM

## 2017-03-08 LAB — COMPREHENSIVE METABOLIC PANEL
ALBUMIN: 4.1 g/dL (ref 3.5–5.2)
ALT: 23 U/L (ref 0–53)
AST: 17 U/L (ref 0–37)
Alkaline Phosphatase: 75 U/L (ref 39–117)
BUN: 10 mg/dL (ref 6–23)
CALCIUM: 10 mg/dL (ref 8.4–10.5)
CHLORIDE: 101 meq/L (ref 96–112)
CO2: 31 mEq/L (ref 19–32)
Creatinine, Ser: 1.05 mg/dL (ref 0.40–1.50)
GFR: 73.83 mL/min (ref >60.00)
Glucose, Bld: 105 mg/dL — ABNORMAL HIGH (ref 70–99)
POTASSIUM: 4 meq/L (ref 3.5–5.1)
SODIUM: 139 meq/L (ref 135–145)
Total Bilirubin: 0.5 mg/dL (ref 0.2–1.2)
Total Protein: 6.6 g/dL (ref 6.0–8.3)

## 2017-03-08 LAB — LIPID PANEL
CHOL/HDL RATIO: 3
Cholesterol: 115 mg/dL (ref 0–200)
HDL: 33.4 mg/dL — AB (ref >39.00)
LDL Cholesterol: 56 mg/dL (ref 0–99)
NONHDL: 81.92
Triglycerides: 132 mg/dL (ref 0.0–149.0)
VLDL: 26.4 mg/dL (ref 0.0–40.0)

## 2017-03-08 LAB — URIC ACID: Uric Acid, Serum: 5.9 mg/dL (ref 4.0–7.8)

## 2017-03-08 LAB — HEMOGLOBIN A1C: Hgb A1c MFr Bld: 6 % (ref 4.6–6.5)

## 2017-03-08 LAB — PSA, MEDICARE: PSA: 0.69 ng/ml (ref 0.10–4.00)

## 2017-03-08 NOTE — Progress Notes (Signed)
I reviewed health advisor's note, was available for consultation, and agree with documentation and plan.  

## 2017-03-08 NOTE — Patient Instructions (Addendum)
Mr. Puertas , Thank you for taking time to come for your Medicare Wellness Visit. I appreciate your ongoing commitment to your health goals. Please review the following plan we discussed and let me know if I can assist you in the future.   These are the goals we discussed: Goals    . Quit smoking / using tobacco (pt-stated)          Starting 03/08/2017, I will work towards quitting smoking completely.       This is a list of the screening recommended for you and due dates:  Health Maintenance  Topic Date Due  . DTaP/Tdap/Td vaccine (1 - Tdap) 05/15/1964  . Tetanus Vaccine  05/15/1964  . Flu Shot  03/28/2017  . Colon Cancer Screening  05/12/2019  .  Hepatitis C: One time screening is recommended by Center for Disease Control  (CDC) for  adults born from [birth year] through 1965.   Completed  . Pneumonia vaccines  Completed   Preventive Care for Adults  A healthy lifestyle and preventive care can promote health and wellness. Preventive health guidelines for adults include the following key practices.  . A routine yearly physical is a good way to check with your health care provider about your health and preventive screening. It is a chance to share any concerns and updates on your health and to receive a thorough exam.  . Visit your dentist for a routine exam and preventive care every 6 months. Brush your teeth twice a day and floss once a day. Good oral hygiene prevents tooth decay and gum disease.  . The frequency of eye exams is based on your age, health, family medical history, use  of contact lenses, and other factors. Follow your health care provider's ecommendations for frequency of eye exams.  . Eat a healthy diet. Foods like vegetables, fruits, whole grains, low-fat dairy products, and lean protein foods contain the nutrients you need without too many calories. Decrease your intake of foods high in solid fats, added sugars, and salt. Eat the right amount of calories for you. Get  information about a proper diet from your health care provider, if necessary.  . Regular physical exercise is one of the most important things you can do for your health. Most adults should get at least 150 minutes of moderate-intensity exercise (any activity that increases your heart rate and causes you to sweat) each week. In addition, most adults need muscle-strengthening exercises on 2 or more days a week.  Silver Sneakers may be a benefit available to you. To determine eligibility, you may visit the website: www.silversneakers.com or contact program at (334) 733-2347 Mon-Fri between 8AM-8PM.   . Maintain a healthy weight. The body mass index (BMI) is a screening tool to identify possible weight problems. It provides an estimate of body fat based on height and weight. Your health care provider can find your BMI and can help you achieve or maintain a healthy weight.   For adults 20 years and older: ? A BMI below 18.5 is considered underweight. ? A BMI of 18.5 to 24.9 is normal. ? A BMI of 25 to 29.9 is considered overweight. ? A BMI of 30 and above is considered obese.   . Maintain normal blood lipids and cholesterol levels by exercising and minimizing your intake of saturated fat. Eat a balanced diet with plenty of fruit and vegetables. Blood tests for lipids and cholesterol should begin at age 57 and be repeated every 5 years. If your lipid or  cholesterol levels are high, you are over 50, or you are at high risk for heart disease, you may need your cholesterol levels checked more frequently. Ongoing high lipid and cholesterol levels should be treated with medicines if diet and exercise are not working.  . If you smoke, find out from your health care provider how to quit. If you do not use tobacco, please do not start.  . If you choose to drink alcohol, please do not consume more than 2 drinks per day. One drink is considered to be 12 ounces (355 mL) of beer, 5 ounces (148 mL) of wine, or 1.5  ounces (44 mL) of liquor.  . If you are 24-12 years old, ask your health care provider if you should take aspirin to prevent strokes.  . Use sunscreen. Apply sunscreen liberally and repeatedly throughout the day. You should seek shade when your shadow is shorter than you. Protect yourself by wearing long sleeves, pants, a wide-brimmed hat, and sunglasses year round, whenever you are outdoors.  . Once a month, do a whole body skin exam, using a mirror to look at the skin on your back. Tell your health care provider of new moles, moles that have irregular borders, moles that are larger than a pencil eraser, or moles that have changed in shape or color.   Steps to Quit Smoking Smoking tobacco can be bad for your health. It can also affect almost every organ in your body. Smoking puts you and people around you at risk for many serious long-lasting (chronic) diseases. Quitting smoking is hard, but it is one of the best things that you can do for your health. It is never too late to quit. What are the benefits of quitting smoking? When you quit smoking, you lower your risk for getting serious diseases and conditions. They can include:  Lung cancer or lung disease.  Heart disease.  Stroke.  Heart attack.  Not being able to have children (infertility).  Weak bones (osteoporosis) and broken bones (fractures).  If you have coughing, wheezing, and shortness of breath, those symptoms may get better when you quit. You may also get sick less often. If you are pregnant, quitting smoking can help to lower your chances of having a baby of low birth weight. What can I do to help me quit smoking? Talk with your doctor about what can help you quit smoking. Some things you can do (strategies) include:  Quitting smoking totally, instead of slowly cutting back how much you smoke over a period of time.  Going to in-person counseling. You are more likely to quit if you go to many counseling sessions.  Using  resources and support systems, such as: ? Database administrator with a Social worker. ? Phone quitlines. ? Careers information officer. ? Support groups or group counseling. ? Text messaging programs. ? Mobile phone apps or applications.  Taking medicines. Some of these medicines may have nicotine in them. If you are pregnant or breastfeeding, do not take any medicines to quit smoking unless your doctor says it is okay. Talk with your doctor about counseling or other things that can help you.  Talk with your doctor about using more than one strategy at the same time, such as taking medicines while you are also going to in-person counseling. This can help make quitting easier. What things can I do to make it easier to quit? Quitting smoking might feel very hard at first, but there is a lot that you can do to  make it easier. Take these steps:  Talk to your family and friends. Ask them to support and encourage you.  Call phone quitlines, reach out to support groups, or work with a Social worker.  Ask people who smoke to not smoke around you.  Avoid places that make you want (trigger) to smoke, such as: ? Bars. ? Parties. ? Smoke-break areas at work.  Spend time with people who do not smoke.  Lower the stress in your life. Stress can make you want to smoke. Try these things to help your stress: ? Getting regular exercise. ? Deep-breathing exercises. ? Yoga. ? Meditating. ? Doing a body scan. To do this, close your eyes, focus on one area of your body at a time from head to toe, and notice which parts of your body are tense. Try to relax the muscles in those areas.  Download or buy apps on your mobile phone or tablet that can help you stick to your quit plan. There are many free apps, such as QuitGuide from the [employer] Office manager for Disease Control and Prevention). You can find more support from smokefree.gov and other websites.  This information is not intended to replace advice given to you by your health  care provider. Make sure you discuss any questions you have with your health care provider. Document Released: 06/10/2009 Document Revised: 04/11/2016 Document Reviewed: 12/29/2014 Elsevier Interactive Patient Education  2018 [employer].

## 2017-03-14 ENCOUNTER — Ambulatory Visit (INDEPENDENT_AMBULATORY_CARE_PROVIDER_SITE_OTHER): Payer: Medicare Other | Admitting: Family Medicine

## 2017-03-14 ENCOUNTER — Encounter: Payer: Self-pay | Admitting: Family Medicine

## 2017-03-14 VITALS — BP 128/90 | HR 81 | Ht 68.0 in | Wt 164.0 lb

## 2017-03-14 DIAGNOSIS — I714 Abdominal aortic aneurysm, without rupture, unspecified: Secondary | ICD-10-CM

## 2017-03-14 DIAGNOSIS — R7303 Prediabetes: Secondary | ICD-10-CM

## 2017-03-14 DIAGNOSIS — F17200 Nicotine dependence, unspecified, uncomplicated: Secondary | ICD-10-CM | POA: Diagnosis not present

## 2017-03-14 DIAGNOSIS — Z7189 Other specified counseling: Secondary | ICD-10-CM

## 2017-03-14 DIAGNOSIS — J432 Centrilobular emphysema: Secondary | ICD-10-CM

## 2017-03-14 DIAGNOSIS — Z0000 Encounter for general adult medical examination without abnormal findings: Secondary | ICD-10-CM

## 2017-03-14 DIAGNOSIS — E785 Hyperlipidemia, unspecified: Secondary | ICD-10-CM | POA: Diagnosis not present

## 2017-03-14 DIAGNOSIS — I2510 Atherosclerotic heart disease of native coronary artery without angina pectoris: Secondary | ICD-10-CM

## 2017-03-14 DIAGNOSIS — I10 Essential (primary) hypertension: Secondary | ICD-10-CM | POA: Diagnosis not present

## 2017-03-14 MED ORDER — AMLODIPINE BESYLATE 5 MG PO TABS: 5.0000 mg | ORAL_TABLET | Freq: Every day | ORAL | 3 refills | Status: DC

## 2017-03-14 NOTE — Assessment & Plan Note (Signed)
By imaging. Pt declines further lung cancer screening at this tim.e

## 2017-03-14 NOTE — Assessment & Plan Note (Signed)
Preventative protocols reviewed and updated unless pt declined. Discussed healthy diet and lifestyle.  

## 2017-03-14 NOTE — Assessment & Plan Note (Signed)
Continue to encourage cessation. Contemplative. 

## 2017-03-14 NOTE — Progress Notes (Signed)
BP 128/90   Pulse 81   Ht 5\' 8"  (1.727 m)   Wt 164 lb (74.4 kg)   SpO2 96%   BMI 24.94 kg/m    CC: CPE Subjective:    Patient ID: Gregory Colon, male    DOB: 1946/01/26, 71 y.o.   MRN: [id number]  HPI: Gregory Colon is a 71 y.o. male presenting on 03/14/2017 for Medicare [place] last week for medicare wellness visit. Note reviewed.  He is considering quitting smoking. He has cut down some.  H/o mild dilatation of infrarenal AA 3cm (12/2014). 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 He has had 2 steroid injections into left wrist  He is regular with HCTZ for blood pressure. Home bp runs well.   Preventative: COLONOSCOPY Laterality: N/A 04/2016 mult TAs, rpt 3 yrs Manya Silvas, MD) Prostate cancer screening - no fmhx. Discussed, would like to defer screening. Lung cancer screening - completed 10/2014. Desires to defer rpt CT for now. Flu [facility]  prevnar 07/2014, pneumovax 02/2016  shingrix - declines  Advanced directive - would want daughter Gregory Colon to be HCPOA. Doesn't want prolonged life support. Not interested in setting up. Seat belt use discussed  Sunscreen use discussed. No changing moles on skin.  Current smoker - working on quitting Alcohol - none  Lives alone, divorced, father of [name] Occupation: retired, was [employer] Activity: yardwork - cutting wood Diet: some water, fruits/vegetables some  Relevant past medical, surgical, family and social history reviewed and updated as indicated. Interim medical history since our last visit reviewed. Allergies and medications reviewed and updated. Outpatient Medications Prior to Visit  Medication Sig Dispense Refill  . atorvastatin (LIPITOR) 40 MG tablet Take 1 tablet (40 mg total) by mouth daily. 30 tablet 11  . colchicine 0.6 MG tablet Take 0.6 mg by mouth daily.    . Multiple Vitamins-Minerals (MULTIVITAMIN ADULTS 50+  PO) Take 1 tablet by mouth daily.    . hydrochlorothiazide (HYDRODIURIL) 25 MG tablet TAKE 1 TABLET (25 MG TOTAL) BY MOUTH DAILY. 90 tablet 3   No facility-administered medications prior to visit.      Per HPI unless specifically indicated in ROS section below Review of Systems  Constitutional: Negative for activity change, appetite change, chills, fatigue, fever and unexpected weight change.  HENT: Negative for hearing loss.   Eyes: Negative for visual disturbance.  Respiratory: Negative for cough, chest tightness, shortness of breath and wheezing.   Cardiovascular: Negative for chest pain, palpitations and leg swelling.  Gastrointestinal: Negative for abdominal distention, abdominal pain, blood in stool, constipation, diarrhea, nausea and vomiting.  Genitourinary: Negative for difficulty urinating and hematuria.  Musculoskeletal: Negative for arthralgias, myalgias and neck pain.  Skin: Negative for rash.  Neurological: Negative for dizziness, seizures, syncope and headaches.  Hematological: Negative for adenopathy. Does not bruise/bleed easily.  Psychiatric/Behavioral: Negative for dysphoric mood. The patient is not nervous/anxious.        Objective:    BP 128/90   Pulse 81   Ht 5\' 8"  (1.727 m)   Wt 164 lb (74.4 kg)   SpO2 96%   BMI 24.94 kg/m   Wt Readings from Last 3 Encounters:  03/14/17 164 lb (74.4 kg)  03/08/17 164 lb 8 oz (74.6 kg)  05/11/16 165 lb (74.8 kg)    Physical Exam  Constitutional: He is oriented to person, place, and time. He appears well-developed and well-nourished.  No distress.  HENT:  Head: Normocephalic and atraumatic.  Right Ear: Hearing, tympanic membrane, external ear and ear canal normal.  Left Ear: Hearing, tympanic membrane, external ear and ear canal normal.  Nose: Nose normal.  Mouth/Throat: Uvula is midline, oropharynx is clear and moist and mucous membranes are normal. No oropharyngeal exudate, posterior oropharyngeal edema or posterior  oropharyngeal erythema.  Eyes: Pupils are equal, round, and reactive to light. Conjunctivae and EOM are normal. No scleral icterus.  Neck: Normal range of motion. Neck supple. Carotid bruit is not present. No thyromegaly present.  Cardiovascular: Normal rate, regular rhythm, normal heart sounds and intact distal pulses.   No murmur heard. Pulses:      Radial pulses are 2+ on the right side, and 2+ on the left side.  Pulmonary/Chest: Effort normal and breath sounds normal. No respiratory distress. He has no wheezes. He has no rales.  Abdominal: Soft. Bowel sounds are normal. He exhibits no distension and no mass. There is no tenderness. There is no rebound and no guarding.  Genitourinary:  Genitourinary Comments: DRE deferred  Musculoskeletal: Normal range of motion. He exhibits no edema.  Lymphadenopathy:    He has no cervical adenopathy.  Neurological: He is alert and oriented to person, place, and time.  CN grossly intact, station and gait intact  Skin: Skin is warm and dry. No rash noted.  Psychiatric: He has a normal mood and affect. His behavior is normal. Judgment and thought content normal.  Nursing note and vitals reviewed.  Results for orders placed or performed in visit on 03/08/17  Hemoglobin A1c  Result Value Ref Range   Hgb A1c MFr Bld 6.0 4.6 - 6.5 %  Lipid panel  Result Value Ref Range   Cholesterol 115 0 - 200 mg/dL   Triglycerides 132.0 0.0 - 149.0 mg/dL   HDL 33.40 (L) >39.00 mg/dL   VLDL 26.4 0.0 - 40.0 mg/dL   LDL Cholesterol 56 0 - 99 mg/dL   Total CHOL/HDL Ratio 3    NonHDL 81.92   Uric acid  Result Value Ref Range   Uric Acid, Serum 5.9 4.0 - 7.8 mg/dL  PSA, Medicare  Result Value Ref Range   PSA 0.69 0.10 - 4.00 ng/ml  Comprehensive metabolic panel  Result Value Ref Range   Sodium 139 135 - 145 mEq/L   Potassium 4.0 3.5 - 5.1 mEq/L   Chloride 101 96 - 112 mEq/L   CO2 31 19 - 32 mEq/L   Glucose, Bld 105 (H) 70 - 99 mg/dL   BUN 10 6 - 23 mg/dL    Creatinine, Ser 1.05 0.40 - 1.50 mg/dL   Total Bilirubin 0.5 0.2 - 1.2 mg/dL   Alkaline Phosphatase 75 39 - 117 U/L   AST 17 0 - 37 U/L   ALT 23 0 - 53 U/L   Total Protein 6.6 6.0 - 8.3 g/dL   Albumin 4.1 3.5 - 5.2 g/dL   Calcium 10.0 8.4 - 10.5 mg/dL   GFR 73.83 >60.00 mL/min      Assessment & Plan:   Problem List Items Addressed This Visit    AAA (abdominal aortic aneurysm) ([place])    Infrarenal by MRI 3cm (12/2014) rec rpt imaging 5 yrs      Relevant Medications   amLODipine (NORVASC) 5 MG tablet   Advanced care planning/counseling discussion    Advanced directive - would want daughter Gregory Colon to be HCPOA. Doesn't want prolonged life support. Not interested in setting up.  Benign essential HTN    Chronic, stable. Given possible gout, will d/c HCTZ and start amlodipine 5mg  daily. I asked pt to update me with effect.       Relevant Medications   amLODipine (NORVASC) 5 MG tablet   CAD (coronary artery disease)    On statin. Advised not to take aspirin by rheum.       Relevant Medications   amLODipine (NORVASC) 5 MG tablet   Dyslipidemia    Chronic, controlled with lipitor 40mg  daily. The ASCVD Risk score Mikey Bussing DC Jr., et al., 2013) failed to calculate for the following reasons:   The valid total cholesterol range is 130 to 320 mg/dL       Emphysema of lung ([place])    By imaging. Pt declines further lung cancer screening at this tim.e       Health maintenance examination - Primary    Preventative protocols reviewed and updated unless pt declined. Discussed healthy diet and lifestyle.       Prediabetes    Discussed avoiding added sugars in diet.       Smoker    Continue to encourage cessation. Contemplative.           Follow up plan: Return in about 1 year (around 03/14/2018) for annual exam, prior fasting for blood work, medicare wellness visit.  Gregory Bush, MD

## 2017-03-14 NOTE — Assessment & Plan Note (Signed)
On statin. Advised not to take aspirin by rheum.

## 2017-03-14 NOTE — Assessment & Plan Note (Signed)
Chronic, stable. Given possible gout, will d/c HCTZ and start amlodipine 5mg  daily. I asked pt to update me with effect.

## 2017-03-14 NOTE — Patient Instructions (Addendum)
Stop hydrochlorothiazide blood pressure pill. Start amlodipine 5mg  daily - new blood pressure pill. Watch for ankle swelling.  If interested, check with pharmacy about new 2 shot shingles series (shingrix).  Good to see you today, call us with questions. Return as needed or in 1 year for next medicare wellness visit with Katha Cabal and physical with me.   Health Maintenance, Male A healthy lifestyle and preventive care is important for your health and wellness. Ask your health care provider about what schedule of regular examinations is right for you. What should I know about weight and diet? Eat a Healthy Diet  Eat plenty of vegetables, fruits, whole grains, low-fat dairy products, and lean protein.  Do not eat a lot of foods high in solid fats, added sugars, or salt.  Maintain a Healthy Weight Regular exercise can help you achieve or maintain a healthy weight. You should:  Do at least 150 minutes of exercise each week. The exercise should increase your heart rate and make you sweat (moderate-intensity exercise).  Do strength-training exercises at least twice a week.  Watch Your Levels of Cholesterol and Blood Lipids  Have your blood tested for lipids and cholesterol every 5 years starting at 72 years of age. If you are at high risk for heart disease, you should start having your blood tested when you are 72 years old. You may need to have your cholesterol levels checked more often if: ? Your lipid or cholesterol levels are high. ? You are older than 72 years of age. ? You are at high risk for heart disease.  What should I know about cancer screening? Many types of cancers can be detected early and may often be prevented. Lung Cancer  You should be screened every year for lung cancer if: ? You are a current smoker who has smoked for at least 30 years. ? You are a former smoker who has quit within the past 15 years.  Talk to your health care provider about your screening options, when  you should start screening, and how often you should be screened.  Colorectal Cancer  Routine colorectal cancer screening usually begins at 72 years of age and should be repeated every 5-10 years until you are 72 years old. You may need to be screened more often if early forms of precancerous polyps or small growths are found. Your health care provider may recommend screening at an earlier age if you have risk factors for colon cancer.  Your health care provider may recommend using home test kits to check for hidden blood in the stool.  A small camera at the end of a tube can be used to examine your colon (sigmoidoscopy or colonoscopy). This checks for the earliest forms of colorectal cancer.  Prostate and Testicular Cancer  Depending on your age and overall health, your health care provider may do certain tests to screen for prostate and testicular cancer.  Talk to your health care provider about any symptoms or concerns you have about testicular or prostate cancer.  Skin Cancer  Check your skin from head to toe regularly.  Tell your health care provider about any new moles or changes in moles, especially if: ? There is a change in a mole's size, shape, or color. ? You have a mole that is larger than a pencil eraser.  Always use sunscreen. Apply sunscreen liberally and repeat throughout the day.  Protect yourself by wearing long sleeves, pants, a wide-brimmed hat, and sunglasses when outside.  What should  I know about heart disease, diabetes, and high blood pressure?  If you are 42-5 years of age, have your blood pressure checked every 3-5 years. If you are 58 years of age or older, have your blood pressure checked every year. You should have your blood pressure measured twice-once when you are at a hospital or clinic, and once when you are not at a hospital or clinic. Record the average of the two measurements. To check your blood pressure when you are not at a hospital or clinic,  you can use: ? An automated blood pressure machine at a pharmacy. ? A home blood pressure monitor.  Talk to your health care provider about your target blood pressure.  If you are between 51-52 years old, ask your health care provider if you should take aspirin to prevent heart disease.  Have regular diabetes screenings by checking your fasting blood sugar level. ? If you are at a normal weight and have a low risk for diabetes, have this test once every three years after the age of 9. ? If you are overweight and have a high risk for diabetes, consider being tested at a younger age or more often.  A one-time screening for abdominal aortic aneurysm (AAA) by ultrasound is recommended for men aged 76-75 years who are current or former smokers. What should I know about preventing infection? Hepatitis B If you have a higher risk for hepatitis B, you should be screened for this virus. Talk with your health care provider to find out if you are at risk for hepatitis B infection. Hepatitis C Blood testing is recommended for:  Everyone born from [birth year] through 1965.  Anyone with known risk factors for hepatitis C.  Sexually Transmitted Diseases (STDs)  You should be screened each year for STDs including gonorrhea and chlamydia if: ? You are sexually active and are younger than 72 years of age. ? You are older than 72 years of age and your health care provider tells you that you are at risk for this type of infection. ? Your sexual activity has changed since you were last screened and you are at an increased risk for chlamydia or gonorrhea. Ask your health care provider if you are at risk.  Talk with your health care provider about whether you are at high risk of being infected with HIV. Your health care provider may recommend a prescription medicine to help prevent HIV infection.  What else can I do?  Schedule regular health, dental, and eye exams.  Stay current with your vaccines  (immunizations).  Do not use any tobacco products, such as cigarettes, chewing tobacco, and e-cigarettes. If you need help quitting, ask your health care provider.  Limit alcohol intake to no more than 2 drinks per day. One drink equals 12 ounces of beer, 5 ounces of wine, or 1 ounces of hard liquor.  Do not use street drugs.  Do not share needles.  Ask your health care provider for help if you need support or information about quitting drugs.  Tell your health care provider if you often feel depressed.  Tell your health care provider if you have ever been abused or do not feel safe at home. This information is not intended to replace advice given to you by your health care provider. Make sure you discuss any questions you have with your health care provider. Document Released: 02/10/2008 Document Revised: 04/12/2016 Document Reviewed: 05/18/2015 Elsevier Interactive Patient Education  [employer].

## 2017-03-14 NOTE — Progress Notes (Signed)
Pre visit review using our clinic review tool, if applicable. No additional management support is needed unless otherwise documented below in the visit note. 

## 2017-03-14 NOTE — Assessment & Plan Note (Signed)
Infrarenal by MRI 3cm (12/2014) rec rpt imaging 5 yrs

## 2017-03-14 NOTE — Assessment & Plan Note (Signed)
Advanced directive - would want daughter Cristy to be HCPOA. Doesn't want prolonged life support. Not interested in setting up.

## 2017-03-14 NOTE — Assessment & Plan Note (Signed)
Chronic, controlled with lipitor 40mg  daily. The ASCVD Risk score Gregory Bussing DC Jr., Gregory al., Gregory Colon) failed to calculate for the following reasons:   The valid total cholesterol range is 130 to 320 mg/dL

## 2017-03-14 NOTE — Assessment & Plan Note (Signed)
Discussed avoiding added sugars in diet. 

## 2017-04-04 ENCOUNTER — Other Ambulatory Visit: Payer: Self-pay | Admitting: Family Medicine

## 2017-04-19 DIAGNOSIS — M25432 Effusion, left wrist: Secondary | ICD-10-CM | POA: Diagnosis not present

## 2017-04-19 DIAGNOSIS — M119 Crystal arthropathy, unspecified: Secondary | ICD-10-CM | POA: Diagnosis not present

## 2017-04-19 DIAGNOSIS — M6580 Other synovitis and tenosynovitis, unspecified site: Secondary | ICD-10-CM | POA: Diagnosis not present

## 2017-04-26 ENCOUNTER — Other Ambulatory Visit: Payer: Self-pay | Admitting: Family Medicine

## 2017-04-26 NOTE — Telephone Encounter (Signed)
Medication is not on pts current med list; pls advise 

## 2017-09-19 DIAGNOSIS — M119 Crystal arthropathy, unspecified: Secondary | ICD-10-CM | POA: Diagnosis not present

## 2017-09-19 DIAGNOSIS — M6580 Other synovitis and tenosynovitis, unspecified site: Secondary | ICD-10-CM | POA: Diagnosis not present

## 2017-09-19 DIAGNOSIS — M25532 Pain in left wrist: Secondary | ICD-10-CM | POA: Diagnosis not present

## 2017-09-19 DIAGNOSIS — G8929 Other chronic pain: Secondary | ICD-10-CM | POA: Diagnosis not present

## 2017-09-21 DIAGNOSIS — M1812 Unilateral primary osteoarthritis of first carpometacarpal joint, left hand: Secondary | ICD-10-CM | POA: Diagnosis not present

## 2017-09-21 DIAGNOSIS — M19032 Primary osteoarthritis, left wrist: Secondary | ICD-10-CM | POA: Diagnosis not present

## 2017-09-21 DIAGNOSIS — M119 Crystal arthropathy, unspecified: Secondary | ICD-10-CM | POA: Diagnosis not present

## 2017-09-21 DIAGNOSIS — M25532 Pain in left wrist: Secondary | ICD-10-CM | POA: Diagnosis not present

## 2017-09-21 DIAGNOSIS — G8929 Other chronic pain: Secondary | ICD-10-CM | POA: Diagnosis not present

## 2017-10-30 DIAGNOSIS — M25532 Pain in left wrist: Secondary | ICD-10-CM | POA: Diagnosis not present

## 2017-10-30 DIAGNOSIS — G8929 Other chronic pain: Secondary | ICD-10-CM | POA: Diagnosis not present

## 2017-10-30 DIAGNOSIS — M1812 Unilateral primary osteoarthritis of first carpometacarpal joint, left hand: Secondary | ICD-10-CM | POA: Diagnosis not present

## 2018-03-05 ENCOUNTER — Other Ambulatory Visit: Payer: Self-pay | Admitting: Family Medicine

## 2018-03-13 ENCOUNTER — Ambulatory Visit: Payer: Medicare Other

## 2018-03-13 ENCOUNTER — Other Ambulatory Visit: Payer: Self-pay | Admitting: Family Medicine

## 2018-03-13 DIAGNOSIS — E785 Hyperlipidemia, unspecified: Secondary | ICD-10-CM

## 2018-03-13 DIAGNOSIS — R7303 Prediabetes: Secondary | ICD-10-CM

## 2018-03-13 DIAGNOSIS — M6580 Other synovitis and tenosynovitis, unspecified site: Secondary | ICD-10-CM

## 2018-03-13 DIAGNOSIS — M119 Crystal arthropathy, unspecified: Secondary | ICD-10-CM

## 2018-03-15 ENCOUNTER — Encounter: Payer: Self-pay | Admitting: Family Medicine

## 2018-03-15 ENCOUNTER — Ambulatory Visit (INDEPENDENT_AMBULATORY_CARE_PROVIDER_SITE_OTHER): Payer: Medicare Other | Admitting: Family Medicine

## 2018-03-15 VITALS — BP 134/78 | HR 77 | Temp 97.6°F | Ht 67.5 in | Wt 175.2 lb

## 2018-03-15 DIAGNOSIS — M119 Crystal arthropathy, unspecified: Secondary | ICD-10-CM | POA: Diagnosis not present

## 2018-03-15 DIAGNOSIS — Z125 Encounter for screening for malignant neoplasm of prostate: Secondary | ICD-10-CM | POA: Diagnosis not present

## 2018-03-15 DIAGNOSIS — R7303 Prediabetes: Secondary | ICD-10-CM

## 2018-03-15 DIAGNOSIS — I10 Essential (primary) hypertension: Secondary | ICD-10-CM

## 2018-03-15 DIAGNOSIS — E785 Hyperlipidemia, unspecified: Secondary | ICD-10-CM | POA: Diagnosis not present

## 2018-03-15 DIAGNOSIS — M6580 Other synovitis and tenosynovitis, unspecified site: Secondary | ICD-10-CM | POA: Diagnosis not present

## 2018-03-15 DIAGNOSIS — Z87891 Personal history of nicotine dependence: Secondary | ICD-10-CM

## 2018-03-15 DIAGNOSIS — Z0000 Encounter for general adult medical examination without abnormal findings: Secondary | ICD-10-CM

## 2018-03-15 DIAGNOSIS — Z7189 Other specified counseling: Secondary | ICD-10-CM

## 2018-03-15 DIAGNOSIS — J432 Centrilobular emphysema: Secondary | ICD-10-CM

## 2018-03-15 LAB — LIPID PANEL
CHOLESTEROL: 135 mg/dL (ref 0–200)
HDL: 37.6 mg/dL — ABNORMAL LOW (ref >39.00)
LDL Cholesterol: 69 mg/dL (ref 0–99)
NonHDL: 97.8
Total CHOL/HDL Ratio: 4
Triglycerides: 145 mg/dL (ref 0.0–149.0)
VLDL: 29 mg/dL (ref 0.0–40.0)

## 2018-03-15 LAB — COMPREHENSIVE METABOLIC PANEL
ALBUMIN: 4.3 g/dL (ref 3.5–5.2)
ALK PHOS: 86 U/L (ref 39–117)
ALT: 27 U/L (ref 0–53)
AST: 18 U/L (ref 0–37)
BUN: 12 mg/dL (ref 6–23)
CO2: 27 mEq/L (ref 19–32)
Calcium: 9.9 mg/dL (ref 8.4–10.5)
Chloride: 105 mEq/L (ref 96–112)
Creatinine, Ser: 1.17 mg/dL (ref 0.40–1.50)
GFR: 64.98 mL/min (ref >60.00)
Glucose, Bld: 107 mg/dL — ABNORMAL HIGH (ref 70–99)
Potassium: 4.1 mEq/L (ref 3.5–5.1)
SODIUM: 140 meq/L (ref 135–145)
TOTAL PROTEIN: 7.6 g/dL (ref 6.0–8.3)
Total Bilirubin: 0.7 mg/dL (ref 0.2–1.2)

## 2018-03-15 LAB — HEMOGLOBIN A1C: Hgb A1c MFr Bld: 6 % (ref 4.6–6.5)

## 2018-03-15 LAB — TSH: TSH: 2.7 u[IU]/mL (ref 0.35–4.50)

## 2018-03-15 LAB — URIC ACID: URIC ACID, SERUM: 6 mg/dL (ref 4.0–7.8)

## 2018-03-15 MED ORDER — HYDROCHLOROTHIAZIDE 25 MG PO TABS: ORAL_TABLET | ORAL | 3 refills | Status: DC

## 2018-03-15 MED ORDER — ATORVASTATIN CALCIUM 40 MG PO TABS: 40.0000 mg | ORAL_TABLET | Freq: Every day | ORAL | 3 refills | Status: DC

## 2018-03-15 MED ORDER — AMLODIPINE BESYLATE 5 MG PO TABS: 5.0000 mg | ORAL_TABLET | Freq: Every day | ORAL | 3 refills | Status: DC

## 2018-03-15 NOTE — Assessment & Plan Note (Signed)
Chronic, stable. Continue current regimen. 

## 2018-03-15 NOTE — Assessment & Plan Note (Signed)
Update labs. Continue lipitor.  The ASCVD Risk score Mikey Bussing DC Jr., et al., 2013) failed to calculate for the following reasons:   The valid total cholesterol range is 130 to 320 mg/dL

## 2018-03-15 NOTE — Patient Instructions (Addendum)
If interested, check with pharmacy about new 2 shot shingles series (shingrix).  Congratulations on quitting smoking!! Keep it up Labs today.  Return as needed or in 1 year for next physical  Health Maintenance, Male A healthy lifestyle and preventive care is important for your health and wellness. Ask your health care provider about what schedule of regular examinations is right for you. What should I know about weight and diet? Eat a Healthy Diet  Eat plenty of vegetables, fruits, whole grains, low-fat dairy products, and lean protein.  Do not eat a lot of foods high in solid fats, added sugars, or salt.  Maintain a Healthy Weight Regular exercise can help you achieve or maintain a healthy weight. You should:  Do at least 150 minutes of exercise each week. The exercise should increase your heart rate and make you sweat (moderate-intensity exercise).  Do strength-training exercises at least twice a week.  Watch Your Levels of Cholesterol and Blood Lipids  Have your blood tested for lipids and cholesterol every 5 years starting at 73 years of age. If you are at high risk for heart disease, you should start having your blood tested when you are 73 years old. You may need to have your cholesterol levels checked more often if: ? Your lipid or cholesterol levels are high. ? You are older than 73 years of age. ? You are at high risk for heart disease.  What should I know about cancer screening? Many types of cancers can be detected early and may often be prevented. Lung Cancer  You should be screened every year for lung cancer if: ? You are a current smoker who has smoked for at least 30 years. ? You are a former smoker who has quit within the past 15 years.  Talk to your health care provider about your screening options, when you should start screening, and how often you should be screened.  Colorectal Cancer  Routine colorectal cancer screening usually begins at 73 years of age  and should be repeated every 5-10 years until you are 73 years old. You may need to be screened more often if early forms of precancerous polyps or small growths are found. Your health care provider may recommend screening at an earlier age if you have risk factors for colon cancer.  Your health care provider may recommend using home test kits to check for hidden blood in the stool.  A small camera at the end of a tube can be used to examine your colon (sigmoidoscopy or colonoscopy). This checks for the earliest forms of colorectal cancer.  Prostate and Testicular Cancer  Depending on your age and overall health, your health care provider may do certain tests to screen for prostate and testicular cancer.  Talk to your health care provider about any symptoms or concerns you have about testicular or prostate cancer.  Skin Cancer  Check your skin from head to toe regularly.  Tell your health care provider about any new moles or changes in moles, especially if: ? There is a change in a mole's size, shape, or color. ? You have a mole that is larger than a pencil eraser.  Always use sunscreen. Apply sunscreen liberally and repeat throughout the day.  Protect yourself by wearing long sleeves, pants, a wide-brimmed hat, and sunglasses when outside.  What should I know about heart disease, diabetes, and high blood pressure?  If you are 70-65 years of age, have your blood pressure checked every 3-5 years. If  you are 30 years of age or older, have your blood pressure checked every year. You should have your blood pressure measured twice-once when you are at a hospital or clinic, and once when you are not at a hospital or clinic. Record the average of the two measurements. To check your blood pressure when you are not at a hospital or clinic, you can use: ? An automated blood pressure machine at a pharmacy. ? A home blood pressure monitor.  Talk to your health care provider about your target blood  pressure.  If you are between 56-78 years old, ask your health care provider if you should take aspirin to prevent heart disease.  Have regular diabetes screenings by checking your fasting blood sugar level. ? If you are at a normal weight and have a low risk for diabetes, have this test once every three years after the age of 56. ? If you are overweight and have a high risk for diabetes, consider being tested at a younger age or more often.  A one-time screening for abdominal aortic aneurysm (AAA) by ultrasound is recommended for men aged 65-75 years who are current or former smokers. What should I know about preventing infection? Hepatitis B If you have a higher risk for hepatitis B, you should be screened for this virus. Talk with your health care provider to find out if you are at risk for hepatitis B infection. Hepatitis C Blood testing is recommended for:  Everyone born from [birth year] through 1966.  Anyone with known risk factors for hepatitis C.  Sexually Transmitted Diseases (STDs)  You should be screened each year for STDs including gonorrhea and chlamydia if: ? You are sexually active and are younger than 73 years of age. ? You are older than 73 years of age and your health care provider tells you that you are at risk for this type of infection. ? Your sexual activity has changed since you were last screened and you are at an increased risk for chlamydia or gonorrhea. Ask your health care provider if you are at risk.  Talk with your health care provider about whether you are at high risk of being infected with HIV. Your health care provider may recommend a prescription medicine to help prevent HIV infection.  What else can I do?  Schedule regular health, dental, and eye exams.  Stay current with your vaccines (immunizations).  Do not use any tobacco products, such as cigarettes, chewing tobacco, and e-cigarettes. If you need help quitting, ask your health care  provider.  Limit alcohol intake to no more than 2 drinks per day. One drink equals 12 ounces of beer, 5 ounces of wine, or 1 ounces of hard liquor.  Do not use street drugs.  Do not share needles.  Ask your health care provider for help if you need support or information about quitting drugs.  Tell your health care provider if you often feel depressed.  Tell your health care provider if you have ever been abused or do not feel safe at home. This information is not intended to replace advice given to you by your health care provider. Make sure you discuss any questions you have with your health care provider. Document Released: 02/10/2008 Document Revised: 04/12/2016 Document Reviewed: 05/18/2015 Elsevier Interactive Patient Education  [employer].

## 2018-03-15 NOTE — Assessment & Plan Note (Signed)
Quit smoking 2 months ago! Congratulated and encouraged cessation. Weight gain noted.

## 2018-03-15 NOTE — Assessment & Plan Note (Signed)

## 2018-03-15 NOTE — Assessment & Plan Note (Signed)
Preventative protocols reviewed and updated unless pt declined. Discussed healthy diet and lifestyle.  

## 2018-03-15 NOTE — Assessment & Plan Note (Addendum)
Found on prior CT. Patient asxs. He quit smoking 2 months ago! Congratulated.

## 2018-03-15 NOTE — Assessment & Plan Note (Signed)
Advanced directive - would want daughter Gregory Colon to be HCPOA. Doesn't want prolonged life support. Not interested in setting up. Declines packet today.

## 2018-03-15 NOTE — Progress Notes (Signed)
BP 134/78 (BP Location: Left Arm, Patient Position: Sitting, Cuff Size: Normal)   Pulse 77   Temp 97.6 F (36.4 C) (Oral)   Ht 5' 7.5" (1.715 m)   Wt 175 lb 4 oz (79.5 kg)   SpO2 97%   BMI 27.04 kg/m    CC: CPE/AMW Subjective:    Patient ID: Gregory Colon, male    DOB: July 10, 1946, 72 y.o.   MRN: [id number]  HPI: Gregory Colon is a 72 y.o. male presenting on 03/15/2018 for Medicare Wellness   Did not see Kristeen Miss this year.  Passes fall and depression screens  Hearing Screening   125Hz  250Hz  500Hz  1000Hz  2000Hz  3000Hz  4000Hz  6000Hz  8000Hz   Right ear:   40 40 25  40    Left ear:   0 0 40  0      Visual Acuity Screening   Right eye Left eye Both eyes  Without correction: 20/25 20/200 20/30  With correction:     Comments: Pt states he wears glasses to drive h/o chronic "lazy eye" on left.  Does not notice trouble with hearing.   H/o mild dilatation of infrarenal AA 3cm (12/2014). 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 He has had 2 steroid injections into left wrist. He is not taking meloxicam.   Preventative: COLONOSCOPY Laterality: N/A 04/2016 mult TAs, rpt 3 yrs Manya Silvas, MD) Prostate cancer screening - no fmhx. Discussed, declines DRE. Would like PSA Lung cancer screening - completed 10/2014. Desires to defer rpt CT for now. Flu yearly at [facility]  prevnar 07/2014, pneumovax 02/2016  shingrix - discussed Advanced directive - would want daughter Gregory Colon to be HCPOA. Doesn't want prolonged life support. Not interested in setting up. Declines packet today.  Seat belt use discussed  Sunscreen use discussed. No changing moles on skin.  Ex smoker - quit 2 months ago.  Alcohol - none Dentist - does not see - has dentures Eye exam - yearly  Lives alone, divorced, father of Reubin Milan Tickle Occupation: retired, was Pension scheme manager Activity: yardwork - cutting wood and mowing lawn,  walking in evenings Diet: some water, fruits/vegetables some  Relevant past medical, surgical, family and social history reviewed and updated as indicated. Interim medical history since our last visit reviewed. Allergies and medications reviewed and updated. Outpatient Medications Prior to Visit  Medication Sig Dispense Refill  . Multiple Vitamins-Minerals (MULTIVITAMIN ADULTS 50+ PO) Take 1 tablet by mouth daily.    Marland Kitchen amLODipine (NORVASC) 5 MG tablet TAKE 1 TABLET BY MOUTH EVERY DAY 90 tablet 0  . atorvastatin (LIPITOR) 40 MG tablet TAKE 1 TABLET (40 MG TOTAL) BY MOUTH DAILY. 30 tablet 11  . colchicine 0.6 MG tablet Take 0.6 mg by mouth daily.    . hydrochlorothiazide (HYDRODIURIL) 25 MG tablet TAKE 1 TABLET (25 MG TOTAL) BY MOUTH DAILY. 90 tablet 3   No facility-administered medications prior to visit.      Per HPI unless specifically indicated in ROS section below Review of Systems  Constitutional: Negative for activity change, appetite change, chills, fatigue, fever and unexpected weight change.  HENT: Negative for hearing loss.   Eyes: Negative for visual disturbance.  Respiratory: Negative for cough, chest tightness, shortness of breath and wheezing.   Cardiovascular: Negative for chest pain, palpitations and leg swelling.  Gastrointestinal: Negative for abdominal distention, abdominal pain, blood in stool, constipation, diarrhea, nausea and vomiting.  Genitourinary: Negative for difficulty urinating  and hematuria.  Musculoskeletal: Negative for arthralgias, myalgias and neck pain.  Skin: Negative for rash.  Neurological: Negative for dizziness, seizures, syncope and headaches.  Hematological: Negative for adenopathy. Bruises/bleeds easily.  Psychiatric/Behavioral: Negative for dysphoric mood. The patient is not nervous/anxious.        Objective:    BP 134/78 (BP Location: Left Arm, Patient Position: Sitting, Cuff Size: Normal)   Pulse 77   Temp 97.6 F (36.4 C) (Oral)    Ht 5' 7.5" (1.715 m)   Wt 175 lb 4 oz (79.5 kg)   SpO2 97%   BMI 27.04 kg/m   Wt Readings from Last 3 Encounters:  03/15/18 175 lb 4 oz (79.5 kg)  03/14/17 164 lb (74.4 kg)  03/08/17 164 lb 8 oz (74.6 kg)    Physical Exam  Constitutional: He is oriented to person, place, and time. He appears well-developed and well-nourished. No distress.  HENT:  Head: Normocephalic and atraumatic.  Right Ear: Hearing, tympanic membrane, external ear and ear canal normal.  Left Ear: Hearing, tympanic membrane, external ear and ear canal normal.  Nose: Nose normal.  Mouth/Throat: Uvula is midline, oropharynx is clear and moist and mucous membranes are normal. No oropharyngeal exudate, posterior oropharyngeal edema or posterior oropharyngeal erythema.  Eyes: Pupils are equal, round, and reactive to light. Conjunctivae and EOM are normal. No scleral icterus.  Neck: Normal range of motion. Neck supple. Carotid bruit is not present. No thyromegaly present.  Cardiovascular: Normal rate, regular rhythm, normal heart sounds and intact distal pulses.  No murmur heard. Pulses:      Radial pulses are 2+ on the right side, and 2+ on the left side.  Pulmonary/Chest: Effort normal and breath sounds normal. No respiratory distress. He has no wheezes. He has no rales.  Abdominal: Soft. Bowel sounds are normal. He exhibits no distension and no mass. There is no tenderness. There is no rebound and no guarding.  Musculoskeletal: Normal range of motion. He exhibits no edema.  Lymphadenopathy:    He has no cervical adenopathy.  Neurological: He is alert and oriented to person, place, and time.  CN grossly intact, station and gait intact  Skin: Skin is warm and dry. No rash noted.  Psychiatric: He has a normal mood and affect. His behavior is normal. Judgment and thought content normal.  Nursing note and vitals reviewed.  Results for orders placed or performed in visit on 03/08/17  Hemoglobin A1c  Result Value Ref  Range   Hgb A1c MFr Bld 6.0 4.6 - 6.5 %  Lipid panel  Result Value Ref Range   Cholesterol 115 0 - 200 mg/dL   Triglycerides 132.0 0.0 - 149.0 mg/dL   HDL 33.40 (L) >39.00 mg/dL   VLDL 26.4 0.0 - 40.0 mg/dL   LDL Cholesterol 56 0 - 99 mg/dL   Total CHOL/HDL Ratio 3    NonHDL 81.92   Uric acid  Result Value Ref Range   Uric Acid, Serum 5.9 4.0 - 7.8 mg/dL  PSA, Medicare  Result Value Ref Range   PSA 0.69 0.10 - 4.00 ng/ml  Comprehensive metabolic panel  Result Value Ref Range   Sodium 139 135 - 145 mEq/L   Potassium 4.0 3.5 - 5.1 mEq/L   Chloride 101 96 - 112 mEq/L   CO2 31 19 - 32 mEq/L   Glucose, Bld 105 (H) 70 - 99 mg/dL   BUN 10 6 - 23 mg/dL   Creatinine, Ser 1.05 0.40 - 1.50 mg/dL  Total Bilirubin 0.5 0.2 - 1.2 mg/dL   Alkaline Phosphatase 75 39 - 117 U/L   AST 17 0 - 37 U/L   ALT 23 0 - 53 U/L   Total Protein 6.6 6.0 - 8.3 g/dL   Albumin 4.1 3.5 - 5.2 g/dL   Calcium 10.0 8.4 - 10.5 mg/dL   GFR 73.83 >60.00 mL/min      Assessment & Plan:   Problem List Items Addressed This Visit    Prediabetes    Update A1c.      Relevant Orders   Hemoglobin A1c   Medicare annual wellness visit, subsequent - Primary    I have personally reviewed the Medicare Annual Wellness questionnaire and have noted 1. The patient's medical and social history 2. Their use of alcohol, tobacco or illicit drugs 3. Their current medications and supplements 4. The patient's functional ability including ADL's, fall risks, home safety risks and hearing or visual impairment. Cognitive function has been assessed and addressed as indicated.  5. Diet and physical activity 6. Evidence for depression or mood disorders The patients weight, height, BMI have been recorded in the chart. I have made referrals, counseling and provided education to the patient based on review of the above and I have provided the pt with a written personalized care plan for preventive services. Provider list updated.. See  scanned questionairre as needed for further documentation. Reviewed preventative protocols and updated unless pt declined.       Health maintenance examination    Preventative protocols reviewed and updated unless pt declined. Discussed healthy diet and lifestyle.       Ex-smoker    Quit smoking 2 months ago! Congratulated and encouraged cessation. Weight gain noted.       Emphysema of lung ([place])    Found on prior CT. Patient asxs. He quit smoking 2 months ago! Congratulated.       Dyslipidemia    Update labs. Continue lipitor.  The ASCVD Risk score Mikey Bussing DC Jr., et al., 2013) failed to calculate for the following reasons:   The valid total cholesterol range is 130 to 320 mg/dL       Relevant Medications   atorvastatin (LIPITOR) 40 MG tablet   Other Relevant Orders   Comprehensive metabolic panel   Lipid panel   TSH   Crystalline arthritis    Of unclear etiology. Ongoing finger pain. Not taking mobic or colchicine.  Sees rheum, doesn't feel he has clear diagnosis.      Benign essential HTN    Chronic, stable. Continue current regimen.       Relevant Medications   amLODipine (NORVASC) 5 MG tablet   atorvastatin (LIPITOR) 40 MG tablet   hydrochlorothiazide (HYDRODIURIL) 25 MG tablet   Advanced care planning/counseling discussion    Advanced directive - would want daughter Gregory Colon to be HCPOA. Doesn't want prolonged life support. Not interested in setting up. Declines packet today.        Other Visit Diagnoses    Special screening for malignant neoplasm of prostate          ADDENDUM --> did not do PSA this year - will need updated next year Lab Results  Component Value Date   PSA 0.69 03/08/2017   PSA 1.02 11/04/2014     Meds ordered this encounter  Medications  . amLODipine (NORVASC) 5 MG tablet    Sig: Take 1 tablet (5 mg total) by mouth daily.    Dispense:  90 tablet    Refill:  3  . atorvastatin (LIPITOR) 40 MG tablet    Sig: Take 1 tablet (40 mg  total) by mouth daily.    Dispense:  90 tablet    Refill:  3  . hydrochlorothiazide (HYDRODIURIL) 25 MG tablet    Sig: TAKE 1 TABLET (25 MG TOTAL) BY MOUTH DAILY.    Dispense:  90 tablet    Refill:  3   Orders Placed This Encounter  Procedures  . Hemoglobin A1c  . Comprehensive metabolic panel  . Lipid panel  . TSH    Follow up plan: Return in about 1 year (around 03/16/2019) for annual exam, prior fasting for blood work, medicare wellness visit.  Gregory Bush, MD

## 2018-03-15 NOTE — Assessment & Plan Note (Signed)
Of unclear etiology. Ongoing finger pain. Not taking mobic or colchicine.  Sees rheum, doesn't feel he has clear diagnosis.

## 2018-03-15 NOTE — Assessment & Plan Note (Signed)
Update A1c ?

## 2018-03-20 ENCOUNTER — Telehealth: Payer: Self-pay | Admitting: Family Medicine

## 2018-03-20 NOTE — Telephone Encounter (Signed)
Pt came in today thinking he had an appointment with dr g.  He is wanting a call to get his lab results  Best number [phone]

## 2018-03-20 NOTE — Telephone Encounter (Signed)
Released via mychart. plz call and notify:  Your kidneys, liver, thyroid and gout levels returned normal. Your cholesterol levels were overall ok Your sugar levels remain in prediabetes range - watch added sugars in the diet.  Continue current medicines.

## 2018-03-21 NOTE — Telephone Encounter (Signed)
Spoke with pt relaying results and instructions per Dr. G.  Pt verbalizes understanding.  

## 2018-05-02 DIAGNOSIS — M12812 Other specific arthropathies, not elsewhere classified, left shoulder: Secondary | ICD-10-CM | POA: Diagnosis not present

## 2018-05-02 DIAGNOSIS — M1812 Unilateral primary osteoarthritis of first carpometacarpal joint, left hand: Secondary | ICD-10-CM | POA: Diagnosis not present

## 2018-05-22 ENCOUNTER — Ambulatory Visit: Payer: Medicare Other

## 2019-03-17 ENCOUNTER — Ambulatory Visit: Payer: Medicare Other

## 2019-03-21 ENCOUNTER — Encounter: Payer: Self-pay | Admitting: Family Medicine

## 2019-03-21 ENCOUNTER — Other Ambulatory Visit: Payer: Self-pay

## 2019-03-21 ENCOUNTER — Ambulatory Visit (INDEPENDENT_AMBULATORY_CARE_PROVIDER_SITE_OTHER): Payer: Medicare Other | Admitting: Family Medicine

## 2019-03-21 VITALS — BP 136/72 | HR 90 | Temp 97.7°F | Resp 16 | Ht 67.5 in | Wt 170.0 lb

## 2019-03-21 DIAGNOSIS — Z87891 Personal history of nicotine dependence: Secondary | ICD-10-CM

## 2019-03-21 DIAGNOSIS — I10 Essential (primary) hypertension: Secondary | ICD-10-CM

## 2019-03-21 DIAGNOSIS — R7303 Prediabetes: Secondary | ICD-10-CM

## 2019-03-21 DIAGNOSIS — Z125 Encounter for screening for malignant neoplasm of prostate: Secondary | ICD-10-CM | POA: Diagnosis not present

## 2019-03-21 DIAGNOSIS — E785 Hyperlipidemia, unspecified: Secondary | ICD-10-CM | POA: Diagnosis not present

## 2019-03-21 DIAGNOSIS — Z0000 Encounter for general adult medical examination without abnormal findings: Secondary | ICD-10-CM

## 2019-03-21 DIAGNOSIS — I2510 Atherosclerotic heart disease of native coronary artery without angina pectoris: Secondary | ICD-10-CM

## 2019-03-21 DIAGNOSIS — I714 Abdominal aortic aneurysm, without rupture, unspecified: Secondary | ICD-10-CM

## 2019-03-21 DIAGNOSIS — Z7189 Other specified counseling: Secondary | ICD-10-CM

## 2019-03-21 LAB — COMPREHENSIVE METABOLIC PANEL
ALT: 18 U/L (ref 0–53)
AST: 16 U/L (ref 0–37)
Albumin: 4.1 g/dL (ref 3.5–5.2)
Alkaline Phosphatase: 82 U/L (ref 39–117)
BUN: 20 mg/dL (ref 6–23)
CO2: 26 mEq/L (ref 19–32)
Calcium: 9.7 mg/dL (ref 8.4–10.5)
Chloride: 106 mEq/L (ref 96–112)
Creatinine, Ser: 1.05 mg/dL (ref 0.40–1.50)
GFR: 69.07 mL/min (ref >60.00)
Glucose, Bld: 105 mg/dL — ABNORMAL HIGH (ref 70–99)
Potassium: 4 mEq/L (ref 3.5–5.1)
Sodium: 141 mEq/L (ref 135–145)
Total Bilirubin: 0.5 mg/dL (ref 0.2–1.2)
Total Protein: 6.7 g/dL (ref 6.0–8.3)

## 2019-03-21 LAB — MICROALBUMIN / CREATININE URINE RATIO
Creatinine,U: 404.1 mg/dL
Microalb Creat Ratio: 0.6 mg/g (ref 0.0–30.0)
Microalb, Ur: 2.6 mg/dL — ABNORMAL HIGH (ref 0.0–1.9)

## 2019-03-21 LAB — LIPID PANEL
Cholesterol: 121 mg/dL (ref 0–200)
HDL: 28.8 mg/dL — ABNORMAL LOW (ref >39.00)
LDL Cholesterol: 68 mg/dL (ref 0–99)
NonHDL: 92.54
Total CHOL/HDL Ratio: 4
Triglycerides: 121 mg/dL (ref 0.0–149.0)
VLDL: 24.2 mg/dL (ref 0.0–40.0)

## 2019-03-21 LAB — TSH: TSH: 1.6 u[IU]/mL (ref 0.35–4.50)

## 2019-03-21 LAB — HEMOGLOBIN A1C: Hgb A1c MFr Bld: 6 % (ref 4.6–6.5)

## 2019-03-21 LAB — PSA: PSA: 0.59 ng/mL (ref 0.10–4.00)

## 2019-03-21 MED ORDER — ATORVASTATIN CALCIUM 40 MG PO TABS: 40.0000 mg | ORAL_TABLET | Freq: Every day | ORAL | 3 refills | Status: DC

## 2019-03-21 MED ORDER — AMLODIPINE BESYLATE 5 MG PO TABS: 5.0000 mg | ORAL_TABLET | Freq: Every day | ORAL | 3 refills | Status: DC

## 2019-03-21 NOTE — Assessment & Plan Note (Signed)
Chronic, stable. Continue current regimen. 

## 2019-03-21 NOTE — Assessment & Plan Note (Signed)
H/o this by CT.

## 2019-03-21 NOTE — Assessment & Plan Note (Signed)
Update lipid panel today. Continue statin.  The 10-year ASCVD risk score Mikey Bussing DC Brooke Bonito., et al., 2013) is: 29.7%   Values used to calculate the score:     Age: 73 years     Sex: Male     Is Non-Hispanic African American: No     Diabetic: No     Tobacco smoker: Yes     Systolic Blood Pressure: 812 mmHg     Is BP treated: Yes     HDL Cholesterol: 37.6 mg/dL     Total Cholesterol: 135 mg/dL

## 2019-03-21 NOTE — Patient Instructions (Addendum)
You will be due for colonoscopy this year - if you don't receive letter from Dr Tiffany Kocher this September, call them or call us and we will refer you back.  Labs today.  Good to see you today. Call us with questions.  Return as needed or in 1 year for next wellness visit.   Health Maintenance After Age 74 After age 44, you are at a higher risk for certain long-term diseases and infections as well as injuries from falls. Falls are a major cause of broken bones and head injuries in people who are older than age 23. Getting regular preventive care can help to keep you healthy and well. Preventive care includes getting regular testing and making lifestyle changes as recommended by your health care provider. Talk with your health care provider about:  Which screenings and tests you should have. A screening is a test that checks for a disease when you have no symptoms.  A diet and exercise plan that is right for you. What should I know about screenings and tests to prevent falls? Screening and testing are the best ways to find a health problem early. Early diagnosis and treatment give you the best chance of managing medical conditions that are common after age 74. Certain conditions and lifestyle choices may make you more likely to have a fall. Your health care provider may recommend:  Regular vision checks. Poor vision and conditions such as cataracts can make you more likely to have a fall. If you wear glasses, make sure to get your prescription updated if your vision changes.  Medicine review. Work with your health care provider to regularly review all of the medicines you are taking, including over-the-counter medicines. Ask your health care provider about any side effects that may make you more likely to have a fall. Tell your health care provider if any medicines that you take make you feel dizzy or sleepy.  Osteoporosis screening. Osteoporosis is a condition that causes the bones to get weaker. This  can make the bones weak and cause them to break more easily.  Blood pressure screening. Blood pressure changes and medicines to control blood pressure can make you feel dizzy.  Strength and balance checks. Your health care provider may recommend certain tests to check your strength and balance while standing, walking, or changing positions.  Foot health exam. Foot pain and numbness, as well as not wearing proper footwear, can make you more likely to have a fall.  Depression screening. You may be more likely to have a fall if you have a fear of falling, feel emotionally low, or feel unable to do activities that you used to do.  Alcohol use screening. Using too much alcohol can affect your balance and may make you more likely to have a fall. What actions can I take to lower my risk of falls? General instructions  Talk with your health care provider about your risks for falling. Tell your health care provider if: ? You fall. Be sure to tell your health care provider about all falls, even ones that seem minor. ? You feel dizzy, sleepy, or off-balance.  Take over-the-counter and prescription medicines only as told by your health care provider. These include any supplements.  Eat a healthy diet and maintain a healthy weight. A healthy diet includes low-fat dairy products, low-fat (lean) meats, and fiber from whole grains, beans, and lots of fruits and vegetables. Home safety  Remove any tripping hazards, such as rugs, cords, and clutter.  Install  safety equipment such as grab bars in bathrooms and safety rails on stairs.  Keep rooms and walkways well-lit. Activity   Follow a regular exercise program to stay fit. This will help you maintain your balance. Ask your health care provider what types of exercise are appropriate for you.  If you need a cane or walker, use it as recommended by your health care provider.  Wear supportive shoes that have nonskid soles. Lifestyle  Do not drink  alcohol if your health care provider tells you not to drink.  If you drink alcohol, limit how much you have: ? 0-1 drink a day for women. ? 0-2 drinks a day for men.  Be aware of how much alcohol is in your drink. In the [place] one drink equals one typical bottle of beer (12 oz), one-half glass of wine (5 oz), or one shot of hard liquor (1 oz).  Do not use any products that contain nicotine or tobacco, such as cigarettes and e-cigarettes. If you need help quitting, ask your health care provider. Summary  Having a healthy lifestyle and getting preventive care can help to protect your health and wellness after age 58.  Screening and testing are the best way to find a health problem early and help you avoid having a fall. Early diagnosis and treatment give you the best chance for managing medical conditions that are more common for people who are older than age 44.  Falls are a major cause of broken bones and head injuries in people who are older than age 28. Take precautions to prevent a fall at home.  Work with your health care provider to learn what changes you can make to improve your health and wellness and to prevent falls. This information is not intended to replace advice given to you by your health care provider. Make sure you discuss any questions you have with your health care provider. Document Released: 06/27/2017 Document Revised: 12/05/2018 Document Reviewed: 06/27/2017 Elsevier Patient Education  2020 [employer].

## 2019-03-21 NOTE — Assessment & Plan Note (Addendum)
Congratulated on remaining abstinent! 

## 2019-03-21 NOTE — Assessment & Plan Note (Signed)
Advanced directive - would want daughter Cristy to be HCPOA. Daughter knows what he wants. Doesn't want prolonged life support. Not interested in setting up. Declines packet today.

## 2019-03-21 NOTE — Assessment & Plan Note (Signed)
Update A1c ?

## 2019-03-21 NOTE — Progress Notes (Signed)
This visit was conducted in person.  BP 136/72 (BP Location: Right Arm)   Pulse 90   Temp 97.7 F (36.5 C)   Resp 16   Ht 5' 7.5" (1.715 m)   Wt 170 lb (77.1 kg)   SpO2 97%   BMI 26.23 kg/m    CC: AMW Subjective:    Patient ID: Gregory Colon, male    DOB: November 24, 1945, 73 y.o.   MRN: [id number]  HPI: Gregory Colon is a 73 y.o. male presenting on 03/21/2019 for Medicare Wellness   Did not see health advisor this year.   Hearing Screening   125Hz  250Hz  500Hz  1000Hz  2000Hz  3000Hz  4000Hz  6000Hz  8000Hz   Right ear:   40 40 40  40    Left ear:   0 0 40  0      Visual Acuity Screening   Right eye Left eye Both eyes  Without correction: 20/25 20/200 20/25  With correction:     Comments: Saw blue color-instead of green. Per patient left eye almost completely blind. Denies hearing trouble.  Passes fall and depression screens.   H/o mild dilatation of infrarenal AA 3cm (12/2014). rec rpt 5 yrs  HTN - HCTZ was discontinued due to possible gout.   L [facility] OA (seronegative arthritis vs crystalline arthropathy) followed by Dr Verne Carrow. He has had 2 steroid injections into left wrist. He is not taking meloxicam.   Preventative: COLONOSCOPY 04/2016 - mult TAs, rpt 3 yrs(Jaydyn Federico Flake, MD)  Prostate cancer screening - no fmhx. Discussed, declines DRE. Would like PSA Lung cancer screening - completed 10/2014. Desires to defer rpt CT for now. Flu yearly [facility]  Prevnar 07/2014, pneumovax7/2017  Shingrix - discussed, declines  Advanced directive - would want daughter Reubin Milan to be HCPOA. Daughter knows what he wants. Doesn't want prolonged life support. Not interested in setting up. Declines packet today.  Seat belt use discussed  Sunscreen use discussed. No changing moles on skin. Ex smoker - quit 2019. 50+ yrs smoking.  Alcohol - none Dentist - does not see - has dentures Eye exam - yearly about due Bowels - no constipation Bladder - no incontinence  Lives  alone, divorced, father of Reubin Milan Tickle Occupation: retired, was Pension scheme manager Activity: yardwork - cutting wood and mowing lawn, walking in evenings Diet: some water, fruits/vegetables some     Relevant past medical, surgical, family and social history reviewed and updated as indicated. Interim medical history since our last visit reviewed. Allergies and medications reviewed and updated. Outpatient Medications Prior to Visit  Medication Sig Dispense Refill  . Multiple Vitamins-Minerals (MULTIVITAMIN ADULTS 50+ PO) Take 1 tablet by mouth daily.    Marland Kitchen atorvastatin (LIPITOR) 40 MG tablet Take 1 tablet (40 mg total) by mouth daily. 90 tablet 3  . amLODipine (NORVASC) 5 MG tablet Take 1 tablet (5 mg total) by mouth daily. 90 tablet 3  . hydrochlorothiazide (HYDRODIURIL) 25 MG tablet TAKE 1 TABLET (25 MG TOTAL) BY MOUTH DAILY. 90 tablet 3   No facility-administered medications prior to visit.      Per HPI unless specifically indicated in ROS section below Review of Systems  Constitutional: Negative for activity change, appetite change, chills, fatigue, fever and unexpected weight change.  HENT: Negative for hearing loss.   Eyes: Negative for visual disturbance.  Respiratory: Negative for cough, chest tightness, shortness of breath and wheezing.   Cardiovascular: Negative for chest pain, palpitations and leg swelling.  Gastrointestinal: Negative for abdominal  distention, abdominal pain, blood in stool, constipation, diarrhea, nausea and vomiting.  Genitourinary: Negative for difficulty urinating and hematuria.  Musculoskeletal: Negative for arthralgias, myalgias and neck pain.  Skin: Negative for rash.  Neurological: Negative for dizziness, seizures, syncope and headaches.  Hematological: Negative for adenopathy. Bruises/bleeds easily.  Psychiatric/Behavioral: Negative for dysphoric mood. The patient is not nervous/anxious.    Objective:    BP 136/72 (BP Location: Right Arm)    Pulse 90   Temp 97.7 F (36.5 C)   Resp 16   Ht 5' 7.5" (1.715 m)   Wt 170 lb (77.1 kg)   SpO2 97%   BMI 26.23 kg/m   Wt Readings from Last 3 Encounters:  03/21/19 170 lb (77.1 kg)  03/15/18 175 lb 4 oz (79.5 kg)  03/14/17 164 lb (74.4 kg)    Physical Exam Vitals signs and nursing note reviewed.  Constitutional:      General: He is not in acute distress.    Appearance: Normal appearance. He is well-developed. He is not ill-appearing.  HENT:     Head: Normocephalic and atraumatic.     Right Ear: Hearing, tympanic membrane, ear canal and external ear normal.     Left Ear: Hearing, tympanic membrane, ear canal and external ear normal.     Nose: Nose normal.     Mouth/Throat:     Mouth: Mucous membranes are moist.     Pharynx: Uvula midline. No oropharyngeal exudate or posterior oropharyngeal erythema.  Eyes:     General: No scleral icterus.    Extraocular Movements: Extraocular movements intact.     Conjunctiva/sclera: Conjunctivae normal.     Pupils: Pupils are equal, round, and reactive to light.  Neck:     Musculoskeletal: Normal range of motion and neck supple.  Cardiovascular:     Rate and Rhythm: Normal rate and regular rhythm.     Pulses: Normal pulses.          Radial pulses are 2+ on the right side and 2+ on the left side.     Heart sounds: Normal heart sounds. No murmur.  Pulmonary:     Effort: Pulmonary effort is normal. No respiratory distress.     Breath sounds: Normal breath sounds. No wheezing, rhonchi or rales.  Abdominal:     General: Abdomen is flat. Bowel sounds are normal. There is no distension.     Palpations: Abdomen is soft. There is no mass.     Tenderness: There is no abdominal tenderness. There is no guarding or rebound.     Hernia: No hernia is present.  Musculoskeletal: Normal range of motion.  Lymphadenopathy:     Cervical: No cervical adenopathy.  Skin:    General: Skin is warm and dry.     Findings: No rash.  Neurological:      General: No focal deficit present.     Mental Status: He is alert and oriented to person, place, and time.     Comments:  CN grossly intact, station and gait intact Recall 3/3 Calculation 4/5 serial 3s   Psychiatric:        Mood and Affect: Mood normal.        Behavior: Behavior normal.        Thought Content: Thought content normal.        Judgment: Judgment normal.       Results for orders placed or performed in visit on 03/15/18  Hemoglobin A1c  Result Value Ref Range   Hgb A1c MFr  Bld 6.0 4.6 - 6.5 %  Comprehensive metabolic panel  Result Value Ref Range   Sodium 140 135 - 145 mEq/L   Potassium 4.1 3.5 - 5.1 mEq/L   Chloride 105 96 - 112 mEq/L   CO2 27 19 - 32 mEq/L   Glucose, Bld 107 (H) 70 - 99 mg/dL   BUN 12 6 - 23 mg/dL   Creatinine, Ser 1.17 0.40 - 1.50 mg/dL   Total Bilirubin 0.7 0.2 - 1.2 mg/dL   Alkaline Phosphatase 86 39 - 117 U/L   AST 18 0 - 37 U/L   ALT 27 0 - 53 U/L   Total Protein 7.6 6.0 - 8.3 g/dL   Albumin 4.3 3.5 - 5.2 g/dL   Calcium 9.9 8.4 - 10.5 mg/dL   GFR 64.98 >60.00 mL/min  Lipid panel  Result Value Ref Range   Cholesterol 135 0 - 200 mg/dL   Triglycerides 145.0 0.0 - 149.0 mg/dL   HDL 37.60 (L) >39.00 mg/dL   VLDL 29.0 0.0 - 40.0 mg/dL   LDL Cholesterol 69 0 - 99 mg/dL   Total CHOL/HDL Ratio 4    NonHDL 97.80   TSH  Result Value Ref Range   TSH 2.70 0.35 - 4.50 uIU/mL  Uric acid  Result Value Ref Range   Uric Acid, Serum 6.0 4.0 - 7.8 mg/dL   Assessment & Plan:   Problem List Items Addressed This Visit    Prediabetes    Update A1c.       Relevant Orders   Hemoglobin A1c   Medicare annual wellness visit, subsequent - Primary    I have personally reviewed the Medicare Annual Wellness questionnaire and have noted 1. The patient's medical and social history 2. Their use of alcohol, tobacco or illicit drugs 3. Their current medications and supplements 4. The patient's functional ability including ADL's, fall risks, home safety  risks and hearing or visual impairment. Cognitive function has been assessed and addressed as indicated.  5. Diet and physical activity 6. Evidence for depression or mood disorders The patients weight, height, BMI have been recorded in the chart. I have made referrals, counseling and provided education to the patient based on review of the above and I have provided the pt with a written personalized care plan for preventive services. Provider list updated.. See scanned questionairre as needed for further documentation. Reviewed preventative protocols and updated unless pt declined.       Health maintenance examination    Preventative protocols reviewed and updated unless pt declined. Discussed healthy diet and lifestyle.       Ex-smoker    Congratulated on remaining abstinent.       Dyslipidemia    Update lipid panel today. Continue statin.  The 10-year ASCVD risk score Mikey Bussing DC Brooke Bonito., et al., 2013) is: 29.7%   Values used to calculate the score:     Age: 52 years     Sex: Male     Is Non-Hispanic African American: No     Diabetic: No     Tobacco smoker: Yes     Systolic Blood Pressure: 867 mmHg     Is BP treated: Yes     HDL Cholesterol: 37.6 mg/dL     Total Cholesterol: 135 mg/dL       Relevant Medications   atorvastatin (LIPITOR) 40 MG tablet   Other Relevant Orders   Lipid panel   Comprehensive metabolic panel   TSH   CAD (coronary artery disease)  H/o this by CT.       Relevant Medications   atorvastatin (LIPITOR) 40 MG tablet   amLODipine (NORVASC) 5 MG tablet   Benign essential HTN    Chronic, stable. Continue current regimen.       Relevant Medications   atorvastatin (LIPITOR) 40 MG tablet   amLODipine (NORVASC) 5 MG tablet   Other Relevant Orders   Microalbumin / creatinine urine ratio   Advanced care planning/counseling discussion    Advanced directive - would want daughter Cristy to be HCPOA. Daughter knows what he wants. Doesn't want prolonged life  support. Not interested in setting up. Declines packet today.       AAA (abdominal aortic aneurysm) ([place])    Will need rpt imaging next month.       Relevant Medications   atorvastatin (LIPITOR) 40 MG tablet   amLODipine (NORVASC) 5 MG tablet    Other Visit Diagnoses    Special screening for malignant neoplasm of prostate       Relevant Orders   PSA       Meds ordered this encounter  Medications  . atorvastatin (LIPITOR) 40 MG tablet    Sig: Take 1 tablet (40 mg total) by mouth daily.    Dispense:  90 tablet    Refill:  3  . amLODipine (NORVASC) 5 MG tablet    Sig: Take 1 tablet (5 mg total) by mouth daily.    Dispense:  90 tablet    Refill:  3   Orders Placed This Encounter  Procedures  . Lipid panel  . Comprehensive metabolic panel  . Hemoglobin A1c  . PSA  . TSH  . Microalbumin / creatinine urine ratio    Follow up plan: Return in about 1 year (around 03/20/2020) for annual exam, prior fasting for blood work, medicare wellness visit.  Gregory Bush, MD  2CPE

## 2019-03-21 NOTE — Assessment & Plan Note (Signed)
Will need rpt imaging next month.

## 2019-03-21 NOTE — Assessment & Plan Note (Signed)
Preventative protocols reviewed and updated unless pt declined. Discussed healthy diet and lifestyle.  

## 2019-03-21 NOTE — Assessment & Plan Note (Signed)

## 2019-05-06 DIAGNOSIS — M1812 Unilateral primary osteoarthritis of first carpometacarpal joint, left hand: Secondary | ICD-10-CM | POA: Diagnosis not present

## 2019-05-06 DIAGNOSIS — M119 Crystal arthropathy, unspecified: Secondary | ICD-10-CM | POA: Diagnosis not present

## 2019-05-06 DIAGNOSIS — M6580 Other synovitis and tenosynovitis, unspecified site: Secondary | ICD-10-CM | POA: Diagnosis not present

## 2019-05-23 DIAGNOSIS — Z86010 Personal history of colonic polyps: Secondary | ICD-10-CM | POA: Diagnosis not present

## 2019-05-28 ENCOUNTER — Encounter: Payer: Self-pay | Admitting: Family Medicine

## 2019-05-28 DIAGNOSIS — Z1211 Encounter for screening for malignant neoplasm of colon: Secondary | ICD-10-CM

## 2019-06-02 ENCOUNTER — Telehealth: Payer: Self-pay

## 2019-06-02 ENCOUNTER — Other Ambulatory Visit: Payer: Self-pay

## 2019-06-02 DIAGNOSIS — Z1211 Encounter for screening for malignant neoplasm of colon: Secondary | ICD-10-CM

## 2019-06-02 DIAGNOSIS — Z86010 Personal history of colonic polyps: Secondary | ICD-10-CM

## 2019-06-02 MED ORDER — NA SULFATE-K SULFATE-MG SULF 17.5-3.13-1.6 GM/177ML PO SOLN: 1.0000 | Freq: Once | ORAL | 0 refills | Status: AC

## 2019-06-02 NOTE — Telephone Encounter (Signed)
Gastroenterology Pre-Procedure Review  Request Date: 06/16/19 Requesting Physician: Dr. Vicente Males  PATIENT REVIEW QUESTIONS: The patient responded to the following health history questions as indicated:    1. Are you having any GI issues? no 2. Do you have a personal history of Polyps? yes (2017 Dr. Vira Agar) 3. Do you have a family history of Colon Cancer or Polyps? no 4. Diabetes Mellitus? no 5. Joint replacements in the past 12 months?no 6. Major health problems in the past 3 months?no 7. Any artificial heart valves, MVP, or defibrillator?no    MEDICATIONS & ALLERGIES:    Patient reports the following regarding taking any anticoagulation/antiplatelet therapy:   Plavix, Coumadin, Eliquis, Xarelto, Lovenox, Pradaxa, Brilinta, or Effient? no Aspirin? no  Patient confirms/reports the following medications:  Current Outpatient Medications  Medication Sig Dispense Refill  . amLODipine (NORVASC) 5 MG tablet Take 1 tablet (5 mg total) by mouth daily. 90 tablet 3  . atorvastatin (LIPITOR) 40 MG tablet Take 1 tablet (40 mg total) by mouth daily. 90 tablet 3  . Multiple Vitamins-Minerals (MULTIVITAMIN ADULTS 50+ PO) Take 1 tablet by mouth daily.     No current facility-administered medications for this visit.     Patient confirms/reports the following allergies:  No Known Allergies  No orders of the defined types were placed in this encounter.   AUTHORIZATION INFORMATION Primary Insurance: 1D#: Group #:  Secondary Insurance: 1D#: Group #:  SCHEDULE INFORMATION: Date: 06/16/19 Time: Location:[facility]

## 2019-06-12 ENCOUNTER — Other Ambulatory Visit
Admission: RE | Admit: 2019-06-12 | Discharge: 2019-06-12 | Disposition: A | Discharge status: Home / Self Care | Payer: Medicare Other | Source: Ambulatory Visit | Attending: Gastroenterology | Admitting: Gastroenterology

## 2019-06-12 ENCOUNTER — Other Ambulatory Visit: Payer: Self-pay

## 2019-06-12 DIAGNOSIS — Z01812 Encounter for preprocedural laboratory examination: Secondary | ICD-10-CM | POA: Diagnosis not present

## 2019-06-12 DIAGNOSIS — Z20828 Contact with and (suspected) exposure to other viral communicable diseases: Secondary | ICD-10-CM | POA: Diagnosis not present

## 2019-06-12 LAB — SARS CORONAVIRUS 2 (TAT 6-24 HRS): SARS Coronavirus 2: NEGATIVE

## 2019-06-16 ENCOUNTER — Encounter
Admission: RE | Disposition: A | Discharge status: Home / Self Care | Payer: Self-pay | Source: Ambulatory Visit | Attending: Gastroenterology

## 2019-06-16 ENCOUNTER — Ambulatory Visit: Payer: Medicare Other | Admitting: Certified Registered Nurse Anesthetist

## 2019-06-16 ENCOUNTER — Other Ambulatory Visit: Payer: Self-pay

## 2019-06-16 ENCOUNTER — Ambulatory Visit
Admission: RE | Admit: 2019-06-16 | Discharge: 2019-06-16 | Disposition: A | Discharge status: Home / Self Care | Payer: Medicare Other | Source: Ambulatory Visit | Attending: Gastroenterology | Admitting: Gastroenterology

## 2019-06-16 ENCOUNTER — Encounter: Payer: Self-pay | Admitting: *Deleted

## 2019-06-16 DIAGNOSIS — J439 Emphysema, unspecified: Secondary | ICD-10-CM | POA: Insufficient documentation

## 2019-06-16 DIAGNOSIS — D124 Benign neoplasm of descending colon: Secondary | ICD-10-CM | POA: Insufficient documentation

## 2019-06-16 DIAGNOSIS — Z87442 Personal history of urinary calculi: Secondary | ICD-10-CM | POA: Insufficient documentation

## 2019-06-16 DIAGNOSIS — D125 Benign neoplasm of sigmoid colon: Secondary | ICD-10-CM | POA: Insufficient documentation

## 2019-06-16 DIAGNOSIS — D120 Benign neoplasm of cecum: Secondary | ICD-10-CM | POA: Diagnosis not present

## 2019-06-16 DIAGNOSIS — I10 Essential (primary) hypertension: Secondary | ICD-10-CM | POA: Insufficient documentation

## 2019-06-16 DIAGNOSIS — Z1211 Encounter for screening for malignant neoplasm of colon: Secondary | ICD-10-CM | POA: Diagnosis not present

## 2019-06-16 DIAGNOSIS — K760 Fatty (change of) liver, not elsewhere classified: Secondary | ICD-10-CM | POA: Insufficient documentation

## 2019-06-16 DIAGNOSIS — E785 Hyperlipidemia, unspecified: Secondary | ICD-10-CM | POA: Diagnosis not present

## 2019-06-16 DIAGNOSIS — Z79899 Other long term (current) drug therapy: Secondary | ICD-10-CM | POA: Insufficient documentation

## 2019-06-16 DIAGNOSIS — M19041 Primary osteoarthritis, right hand: Secondary | ICD-10-CM | POA: Insufficient documentation

## 2019-06-16 DIAGNOSIS — I2510 Atherosclerotic heart disease of native coronary artery without angina pectoris: Secondary | ICD-10-CM | POA: Insufficient documentation

## 2019-06-16 DIAGNOSIS — Z87891 Personal history of nicotine dependence: Secondary | ICD-10-CM | POA: Insufficient documentation

## 2019-06-16 DIAGNOSIS — K635 Polyp of colon: Secondary | ICD-10-CM

## 2019-06-16 DIAGNOSIS — Z86010 Personal history of colonic polyps: Secondary | ICD-10-CM | POA: Diagnosis not present

## 2019-06-16 DIAGNOSIS — D126 Benign neoplasm of colon, unspecified: Secondary | ICD-10-CM | POA: Diagnosis not present

## 2019-06-16 HISTORY — PX: COLONOSCOPY WITH PROPOFOL: SHX5780

## 2019-06-16 SURGERY — COLONOSCOPY WITH PROPOFOL
Anesthesia: General

## 2019-06-16 MED ORDER — PROPOFOL 500 MG/50ML IV EMUL
INTRAVENOUS | Status: DC | PRN
  Administered 2019-06-16: 175 ug/kg/min via INTRAVENOUS

## 2019-06-16 MED ORDER — PROPOFOL 500 MG/50ML IV EMUL
INTRAVENOUS | Status: AC
  Filled 2019-06-16: qty 50

## 2019-06-16 MED ORDER — LIDOCAINE HCL (PF) 2 % IJ SOLN
INTRAMUSCULAR | Status: AC
  Filled 2019-06-16: qty 10

## 2019-06-16 MED ORDER — SODIUM CHLORIDE 0.9 % IV SOLN
INTRAVENOUS | Status: DC
  Administered 2019-06-16: 09:00:00 via INTRAVENOUS

## 2019-06-16 MED ORDER — PROPOFOL 10 MG/ML IV BOLUS
INTRAVENOUS | Status: DC | PRN
  Administered 2019-06-16: 10 mg via INTRAVENOUS
  Administered 2019-06-16: 60 mg via INTRAVENOUS

## 2019-06-16 MED ORDER — LIDOCAINE HCL (CARDIAC) PF 100 MG/5ML IV SOSY
PREFILLED_SYRINGE | INTRAVENOUS | Status: DC | PRN
  Administered 2019-06-16: 50 mg via INTRAVENOUS

## 2019-06-16 MED ORDER — PHENYLEPHRINE HCL (PRESSORS) 10 MG/ML IV SOLN
INTRAVENOUS | Status: DC | PRN
  Administered 2019-06-16: 150 ug via INTRAVENOUS
  Administered 2019-06-16: 100 ug via INTRAVENOUS

## 2019-06-16 NOTE — Op Note (Signed)
[facility] Gastroenterology Patient Name: Gregory Colon Procedure Date: 06/16/2019 9:18 AM MRN: [id number] Account #: [id number] Date of Birth: 09-19-1945 Admit Type: Outpatient Age: 73 Room: [facility] ENDO ROOM 4 Gender: Male Note Status: Finalized Procedure:            Colonoscopy Indications:          High risk colon cancer surveillance: Personal history                        of colonic polyps Providers:            Jonathon Bellows MD, MD Referring MD:         [name] (Referring MD) Medicines:            Monitored Anesthesia Care Complications:        No immediate complications. Procedure:            Pre-Anesthesia Assessment:                       - Prior to the procedure, a History and Physical was                        performed, and patient medications, allergies and                        sensitivities were reviewed. The patient's tolerance of                        previous anesthesia was reviewed.                       - The risks and benefits of the procedure and the                        sedation options and risks were discussed with the                        patient. All questions were answered and informed                        consent was obtained.                       - ASA Grade Assessment: III - A patient with severe                        systemic disease.                       After obtaining informed consent, the colonoscope was                        passed under direct vision. Throughout the procedure,                        the patient's blood pressure, pulse, and oxygen                        saturations were monitored continuously. The                        Colonoscope was  introduced through the anus and                        advanced to the the cecum, identified by the                        appendiceal orifice. The colonoscopy was performed with                        ease. The patient tolerated the procedure well. The                     quality of the bowel preparation was adequate. Findings:      The perianal and digital rectal examinations were normal.      Two sessile polyps were found in the sigmoid colon and cecum. The polyps       were 5 to 7 mm in size. These polyps were removed with a cold snare.       Resection and retrieval were complete.      Two sessile polyps were found in the descending colon. The polyps were 6       to 8 mm in size. These polyps were removed with a cold snare. Resection       and retrieval were complete.      The exam was otherwise without abnormality on direct and retroflexion       views. Impression:           - Two 5 to 7 mm polyps in the sigmoid colon and in the                        cecum, removed with a cold snare. Resected and                        retrieved.                       - Two 6 to 8 mm polyps in the descending colon, removed                        with a cold snare. Resected and retrieved.                       - The examination was otherwise normal on direct and                        retroflexion views. Recommendation:       - Discharge patient to home (with escort).                       - Resume previous diet.                       - Continue present medications.                       - Await pathology results.                       - Repeat colonoscopy for surveillance based on  pathology results. Procedure Code(s):    --- Professional ---                       [id number], Colonoscopy, flexible; with removal of tumor(s),                        polyp(s), or other lesion(s) by snare technique Diagnosis Code(s):    --- Professional ---                       Z86.010, Personal history of colonic polyps                       K63.5, Polyp of colon CPT copyright 2019 American Medical Association. All rights reserved. The codes documented in this report are preliminary and upon coder review may  be revised to meet current compliance  requirements. Jonathon Bellows, MD Jonathon Bellows MD, MD 06/16/2019 9:47:47 AM This report has been signed electronically. Number of Addenda: 0 Note Initiated On: 06/16/2019 9:18 AM Scope Withdrawal Time: 0 hours 15 minutes 57 seconds  Total Procedure Duration: 0 hours 21 minutes 18 seconds  Estimated Blood Loss: Estimated blood loss: none.      [facility]

## 2019-06-16 NOTE — H&P (Signed)
Jonathon Bellows, MD [address] [place], [place], [place], [address] [address], [place], [place], [place], [address] Phone: [phone]  Fax: [phone]  Primary Care Physician:  [name], MD   Pre-Procedure History & Physical: HPI:  Gregory Colon is a 73 y.o. male is here for an colonoscopy.   Past Medical History:  Diagnosis Date  . AAA (abdominal aortic aneurysm) ([place]) 11/2014   by MRI 3cm  . Arthritis    R hand  . CAD (coronary artery disease) 10/2014   by CT  . Emphysema of lung ([place]) 10/2014   by CT  . Fatty liver 11/2014   by [place]  . History of chicken pox   . History of kidney stones 1966  . Hyperlipidemia   . Hypertension   . Kidney cysts 11/2014   R>L  . Liver cyst    multiple  . Smoker     Past Surgical History:  Procedure Laterality Date  . CHOLECYSTECTOMY  2000  . COLONOSCOPY N/A 01/08/2015   mult TAs Manya Silvas, MD)  . COLONOSCOPY N/A 05/11/2016   TAs and SSA, rpt 3 yrs (Dr Manya Silvas)  . ESOPHAGOGASTRODUODENOSCOPY N/A 01/08/2015   gastritis, HH Manya Silvas, MD)    Prior to Admission medications   Medication Sig Start Date End Date Taking? Authorizing Provider  amLODipine (NORVASC) 5 MG tablet Take 1 tablet (5 mg total) by mouth daily. 03/21/19  Yes [name], MD  atorvastatin (LIPITOR) 40 MG tablet Take 1 tablet (40 mg total) by mouth daily. 03/21/19  Yes [name], MD  Multiple Vitamins-Minerals (MULTIVITAMIN ADULTS 50+ PO) Take 1 tablet by mouth daily.   Yes [provider]    Allergies as of 06/02/2019  . (No Known Allergies)    Family History  Problem Relation Age of Onset  . Arthritis Mother   . Cancer Brother        lung  . Hypertension Neg Hx   . CAD Neg Hx   . Stroke Neg Hx   . Diabetes Neg Hx     Social History   Socioeconomic History  . Marital status: Single    Spouse name: Not on file  . Number of children: Not on file  . Years of education: Not on file  .  Highest education level: Not on file  Occupational History  . Not on file  Social Needs  . Financial resource strain: Not on file  . Food insecurity    Worry: Not on file    Inability: Not on file  . Transportation needs    Medical: Not on file    Non-medical: Not on file  Tobacco Use  . Smoking status: Former Smoker    Packs/day: 0.25    Types: Cigarettes    Start date: 08/28/1964    Quit date: 12/26/2016    Years since quitting: 2.4  . Smokeless tobacco: Never Used  . Tobacco comment: 30+ PY  Substance and Sexual Activity  . Alcohol use: No    Alcohol/week: 0.0 standard drinks  . Drug use: No  . Sexual activity: Never  Lifestyle  . Physical activity    Days per week: Not on file    Minutes per session: Not on file  . Stress: Not on file  Relationships  . Social Herbalist on phone: Not on file    Gets together: Not on file    Attends religious service: Not on file  Active member of club or organization: Not on file    Attends meetings of clubs or organizations: Not on file    Relationship status: Not on file  . Intimate partner violence    Fear of current or ex partner: Not on file    Emotionally abused: Not on file    Physically abused: Not on file    Forced sexual activity: Not on file  Other Topics Concern  . 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 Neck:  Supple; no masses or thyromegaly. Lungs:  Clear throughout to auscultation, normal respiratory effort.    Heart:   +S1, +S2, Regular rate and rhythm, No edema. Abdomen:  Soft, nontender and nondistended. Normal bowel sounds, without guarding, and without rebound.   Neurologic:  Alert and  oriented x4;  grossly normal neurologically.  Impression/Plan: [name] is here for an colonoscopy to be performed for surveillance due to prior history of colon polyps   Risks, benefits, limitations, and alternatives regarding  colonoscopy have been reviewed with the patient.  Questions have been answered.  All parties agreeable.   Jonathon Bellows, MD  06/16/2019, 9:11 AM

## 2019-06-16 NOTE — Anesthesia Procedure Notes (Signed)
Date/Time: 06/16/2019 9:20 AM Performed by: Johnna Acosta, CRNA Pre-anesthesia Checklist: Patient identified, Emergency Drugs available, Suction available, Patient being monitored and Timeout performed Patient Re-evaluated:Patient Re-evaluated prior to induction Oxygen Delivery Method: Nasal cannula Preoxygenation: Pre-oxygenation with 100% oxygen Induction Type: IV induction

## 2019-06-16 NOTE — Anesthesia Post-op Follow-up Note (Signed)
Anesthesia QCDR form completed.        

## 2019-06-16 NOTE — Transfer of Care (Signed)
Immediate Anesthesia Transfer of Care Note  Patient: Gregory Colon  Procedure(s) Performed: COLONOSCOPY WITH PROPOFOL (N/A )  Patient Location: PACU  Anesthesia Type:General  Level of Consciousness: drowsy  Airway & Oxygen Therapy: Patient Spontanous Breathing  Post-op Assessment: Report given to RN and Post -op Vital signs reviewed and stable  Post vital signs: Reviewed  Last Vitals:  Vitals Value Taken Time  BP 90/53 06/16/19 0953  Temp 36.2 C 06/16/19 0953  Pulse 78 06/16/19 0953  Resp 18 06/16/19 0953  SpO2 94 % 06/16/19 0953    Last Pain:  Vitals:   06/16/19 0952  TempSrc: Tympanic  PainSc: Asleep         Complications: No apparent anesthesia complications

## 2019-06-16 NOTE — Anesthesia Preprocedure Evaluation (Signed)
Anesthesia Evaluation  Patient identified by MRN, date of birth, ID band Patient awake    Reviewed: Allergy & Precautions, H&P , NPO status , Patient's Chart, lab work & pertinent test results, reviewed documented beta blocker date and time   Airway Mallampati: II   Neck ROM: full    Dental  (+) Poor Dentition   Pulmonary COPD, former smoker,    Pulmonary exam normal        Cardiovascular Exercise Tolerance: Good hypertension, On Medications + CAD  Normal cardiovascular exam Rhythm:regular Rate:Normal     Neuro/Psych negative neurological ROS  negative psych ROS   GI/Hepatic negative GI ROS, Neg liver ROS,   Endo/Other  negative endocrine ROS  Renal/GU Renal disease  negative genitourinary   Musculoskeletal   Abdominal   Peds  Hematology negative hematology ROS (+)   Anesthesia Other Findings Past Medical History: 11/2014: AAA (abdominal aortic aneurysm) ([place])     Comment:  by MRI 3cm No date: Arthritis     Comment:  R hand 10/2014: CAD (coronary artery disease)     Comment:  by CT 10/2014: Emphysema of lung ([place])     Comment:  by CT 11/2014: Fatty liver     Comment:  by [place] No date: History of chicken pox 1966: History of kidney stones No date: Hyperlipidemia No date: Hypertension 11/2014: Kidney cysts     Comment:  R>L No date: Liver cyst     Comment:  multiple No date: Smoker Past Surgical History: 2000: CHOLECYSTECTOMY 01/08/2015: COLONOSCOPY; N/A     Comment:  mult TAs Manya Silvas, MD) 05/11/2016: COLONOSCOPY; N/A     Comment:  TAs and SSA, rpt 3 yrs (Dr Manya Silvas) 01/08/2015: ESOPHAGOGASTRODUODENOSCOPY; N/A     Comment:  gastritis, HH Manya Silvas, MD) BMI    Body Mass Index: 28.73 kg/m     Reproductive/Obstetrics negative OB ROS                             Anesthesia Physical Anesthesia Plan  ASA: III  Anesthesia Plan: General   Post-op Pain  Management:    Induction:   PONV Risk Score and Plan:   Airway Management Planned:   Additional Equipment:   Intra-op Plan:   Post-operative Plan:   Informed Consent: I have reviewed the patients History and Physical, chart, labs and discussed the procedure including the risks, benefits and alternatives for the proposed anesthesia with the patient or authorized representative who has indicated his/her understanding and acceptance.     Dental Advisory Given  Plan Discussed with: CRNA  Anesthesia Plan Comments:         Anesthesia Quick Evaluation

## 2019-06-17 ENCOUNTER — Encounter: Payer: Self-pay | Admitting: Family Medicine

## 2019-06-17 LAB — SURGICAL PATHOLOGY

## 2019-06-22 ENCOUNTER — Encounter: Payer: Self-pay | Admitting: Gastroenterology

## 2019-06-24 NOTE — Anesthesia Postprocedure Evaluation (Signed)
Anesthesia Post Note  Patient: Gregory Colon  Procedure(s) Performed: COLONOSCOPY WITH PROPOFOL (N/A )  Patient location during evaluation: PACU Anesthesia Type: General Level of consciousness: awake and alert Pain management: pain level controlled Vital Signs Assessment: post-procedure vital signs reviewed and stable Respiratory status: spontaneous breathing, nonlabored ventilation, respiratory function stable and patient connected to nasal cannula oxygen Cardiovascular status: blood pressure returned to baseline and stable Postop Assessment: no apparent nausea or vomiting Anesthetic complications: no     Last Vitals:  Vitals:   06/16/19 1003 06/16/19 1012  BP: 102/76 123/84  Pulse: 82 69  Resp: 18 (!) 23  Temp:    SpO2: 95% 96%    Last Pain:  Vitals:   06/16/19 1012  TempSrc:   PainSc: 0-No pain                 Molli Barrows

## 2019-06-30 ENCOUNTER — Encounter: Payer: Self-pay | Admitting: Family Medicine

## 2019-08-16 DIAGNOSIS — Z719 Counseling, unspecified: Secondary | ICD-10-CM | POA: Diagnosis not present

## 2020-03-23 ENCOUNTER — Ambulatory Visit: Payer: Medicare Other

## 2020-03-25 ENCOUNTER — Ambulatory Visit (INDEPENDENT_AMBULATORY_CARE_PROVIDER_SITE_OTHER): Payer: Medicare Other

## 2020-03-25 ENCOUNTER — Other Ambulatory Visit: Payer: Self-pay

## 2020-03-25 DIAGNOSIS — Z0000 Encounter for general adult medical examination without abnormal findings: Secondary | ICD-10-CM | POA: Diagnosis not present

## 2020-03-25 NOTE — Progress Notes (Signed)
Subjective:   Gregory Colon is a 74 y.o. male who presents for Medicare Annual/Subsequent preventive examination.  Review of Systems: N/A      I connected with the patient today by telephone and verified that I am speaking with the correct person using two identifiers. Location patient: home Location nurse: work Persons participating in the virtual visit: patient, Marine scientist.   I discussed the limitations, risks, security and privacy concerns of performing an evaluation and management service by telephone and the availability of in person appointments. I also discussed with the patient that there may be a patient responsible charge related to this service. The patient expressed understanding and verbally consented to this telephonic visit.    Interactive audio and video telecommunications were attempted between this nurse and patient, however failed, due to patient having technical difficulties OR patient did not have access to video capability.  We continued and completed visit with audio only.     Cardiac Risk Factors include: advanced age (>84men, >77 women);hypertension;dyslipidemia;male gender     Objective:    Today's Vitals   There is no height or weight on file to calculate BMI.  Advanced Directives 03/25/2020 06/16/2019 03/08/2017 05/11/2016 01/08/2015  Does Patient Have a Medical Advance Directive? No No No No No  Would patient like information on creating a medical advance directive? No - Patient declined No - Patient declined No - Patient declined No - patient declined information No - patient declined information    Current Medications (verified) Outpatient Encounter Medications as of 03/25/2020  Medication Sig  . amLODipine (NORVASC) 5 MG tablet Take 1 tablet (5 mg total) by mouth daily.  Marland Kitchen atorvastatin (LIPITOR) 40 MG tablet Take 1 tablet (40 mg total) by mouth daily.  . Multiple Vitamins-Minerals (MULTIVITAMIN ADULTS 50+ PO) Take 1 tablet by mouth daily.   No  facility-administered encounter medications on file as of 03/25/2020.    Allergies (verified) Patient has no known allergies.   History: Past Medical History:  Diagnosis Date  . AAA (abdominal aortic aneurysm) ([place]) 11/2014   by MRI 3cm  . Arthritis    R hand  . CAD (coronary artery disease) 10/2014   by CT  . Emphysema of lung ([place]) 10/2014   by CT  . Fatty liver 11/2014   by [place]  . History of chicken pox   . History of kidney stones 1966  . Hyperlipidemia   . Hypertension   . Kidney cysts 11/2014   R>L  . Liver cyst    multiple  . Smoker    Past Surgical History:  Procedure Laterality Date  . CHOLECYSTECTOMY  2000  . COLONOSCOPY N/A 01/08/2015   mult TAs Manya Silvas, MD)  . COLONOSCOPY N/A 05/11/2016   TAs and SSA, rpt 3 yrs (Dr Manya Silvas)  . COLONOSCOPY WITH PROPOFOL N/A 06/16/2019   TA x4, no f/u recommended Vicente Males, Bailey Mech, MD)  . ESOPHAGOGASTRODUODENOSCOPY N/A 01/08/2015   gastritis, HH Manya Silvas, MD)   Family History  Problem Relation Age of Onset  . Arthritis Mother   . Cancer Brother        lung  . Hypertension Neg Hx   . CAD Neg Hx   . Stroke Neg Hx   . Diabetes Neg Hx    Social History   Socioeconomic History  . Marital status: Single    Spouse name: Not on file  . Number of children: Not on file  . Years of education: Not on file  .  Highest education level: Not on file  Occupational History  . Not on file  Tobacco Use  . Smoking status: Former Smoker    Packs/day: 0.25    Types: Cigarettes    Start date: 08/28/1964    Quit date: 12/26/2016    Years since quitting: 3.2  . Smokeless tobacco: Never Used  . Tobacco comment: 30+ PY  Substance and Sexual Activity  . Alcohol use: No    Alcohol/week: 0.0 standard drinks  . Drug use: No  . Sexual activity: Never  Other Topics Concern  . Not on file  Social History Narrative   Lives alone, divorced, father of [name]   Occupation: retired, was Barrister's clerk shop    Activity: yardwork - cutting wood   Diet: some water, fruits/vegetables some   Social Determinants of Radio broadcast assistant Strain: [place]   . Difficulty of Paying Living Expenses: Not hard at all  Food Insecurity: No Food Insecurity  . Worried About Charity fundraiser in the Last Year: Never true  . Ran Out of Food in the Last Year: Never true  Transportation Needs: No Transportation Needs  . Lack of Transportation (Medical): No  . Lack of Transportation (Non-Medical): No  Physical Activity: Inactive  . Days of Exercise per Week: 0 days  . Minutes of Exercise per Session: 0 min  Stress: No Stress Concern Present  . Feeling of Stress : Not at all  Social Connections:   . Frequency of Communication with Friends and Family:   . Frequency of Social Gatherings with Friends and Family:   . Attends Religious Services:   . Active Member of Clubs or Organizations:   . Attends Archivist Meetings:   Marland Kitchen Marital Status:     Tobacco Counseling Counseling given: Not Answered Comment: 30+ PY   Clinical Intake:  Pre-visit preparation completed: Yes  Pain : No/denies pain     Nutritional Risks: None Diabetes: No  How often do you need to have someone help you when you read instructions, pamphlets, or other written materials from your doctor or pharmacy?: 1 - Never What is the last grade level you completed in school?: 7th  Diabetic: No Nutrition Risk Assessment:  Has the patient had any N/V/D within the last 2 months?  No  Does the patient have any non-healing wounds?  No  Has the patient had any unintentional weight loss or weight gain?  No   Diabetes:  Is the patient diabetic?  No  If diabetic, was a CBG obtained today?  No  Did the patient bring in their glucometer from home?  No  How often do you monitor your CBG's? N/A.   Financial Strains and Diabetes Management:  Are you having any financial strains with the device, your supplies or your  medication? N/A.  Does the patient want to be seen by Chronic Care Management for management of their diabetes?  N/A Would the patient like to be referred to a Nutritionist or for Diabetic Management?  N/A    Interpreter Needed?: No  Information entered by :: CJohnson, LPN   Activities of Daily Living In your present state of health, do you have any difficulty performing the following activities: 03/25/2020  Hearing? N  Vision? N  Difficulty concentrating or making decisions? N  Walking or climbing stairs? N  Dressing or bathing? N  Doing errands, shopping? N  Preparing Food and eating ? N  Using the Toilet? N  In  the past six months, have you accidently leaked urine? N  Do you have problems with loss of bowel control? N  Managing your Medications? N  Managing your Finances? N  Housekeeping or managing your Housekeeping? N  Some recent data might be hidden    Patient Care Team: [name], MD as PCP - General (Family Medicine) Marlowe Sax, MD (Rheumatology)  Indicate any recent Medical Services you may have received from other than [facility] providers in the past year (date may be approximate).     Assessment:   This is a routine wellness examination for [place].  Hearing/Vision screen  Hearing Screening   125Hz  250Hz  500Hz  1000Hz  2000Hz  3000Hz  4000Hz  6000Hz  8000Hz   Right ear:           Left ear:           Vision Screening Comments: Patient gets annual eye exams  Dietary issues and exercise activities discussed: Current Exercise Habits: The patient does not participate in regular exercise at present, Exercise limited by: None identified  Goals    .  Patient Stated      03/25/2020, I will maintain and continue medications as prescribed.     .  Quit smoking / using tobacco (pt-stated)      Starting 03/08/2017, I will work towards quitting smoking completely.      Depression Screen PHQ 2/9 Scores 03/25/2020 03/21/2019 03/15/2018 03/08/2017 03/02/2016  11/22/2015 11/11/2014  PHQ - 2 Score 0 0 0 0 0 0 0  PHQ- 9 Score 0 - - - - - -    Fall Risk Fall Risk  03/25/2020 03/21/2019 03/15/2018 03/08/2017 03/02/2016  Falls in the past year? 0 0 No No No  Number falls in past yr: 0 0 - - -  Injury with Fall? 0 0 - - -  Risk for fall due to : Medication side effect - - - -  Follow up Falls evaluation completed;Falls prevention discussed - - - -    Any stairs in or around the home? Yes  If so, are there any without handrails? No  Home free of loose throw rugs in walkways, pet beds, electrical cords, etc? Yes  Adequate lighting in your home to reduce risk of falls? Yes   ASSISTIVE DEVICES UTILIZED TO PREVENT FALLS:  Life alert? No  Use of a cane, walker or w/c? No  Grab bars in the bathroom? No  Shower chair or bench in shower? No  Elevated toilet seat or a handicapped toilet? No   TIMED UP AND GO:  Was the test performed? N/A, telephonic visit .    Cognitive Function: MMSE - Mini Mental State Exam 03/25/2020 03/08/2017  Orientation to time 5 5  Orientation to Place 5 5  Registration 3 3  Attention/ Calculation 5 0  Recall 3 2  Language- name 2 objects - 0  Language- repeat 1 1  Language- follow 3 step command - 3  Language- read & follow direction - 0  Write a sentence - 0  Copy design - 0  Total score - 19  Mini Cog  Mini-Cog screen was completed. Maximum score is 22. A value of 0 denotes this part of the MMSE was not completed or the patient failed this part of the Mini-Cog screening.       Immunizations Immunization History  Administered Date(s) Administered  . Influenza, High Dose Seasonal PF 06/20/2016, 06/11/2017, 06/17/2018, 06/23/2019  . Influenza-Unspecified 06/28/2014  . Pneumococcal Conjugate-13 08/07/2014  . Pneumococcal Polysaccharide-23 03/02/2016  TDAP status: Due, Education has been provided regarding the importance of this vaccine. Advised may receive this vaccine at local pharmacy or Health Dept. Aware to  provide a copy of the vaccination record if obtained from local pharmacy or Health Dept. Verbalized acceptance and understanding. Flu Vaccine status: Up to date Pneumococcal vaccine status: Up to date Covid-19 vaccine status: Completed vaccines Will bring vaccine card to upcoming appointment so we can document dates.  Qualifies for Shingles Vaccine? Yes   Zostavax completed No   Shingrix Completed?: No.    Education has been provided regarding the importance of this vaccine. Patient has been advised to call insurance company to determine out of pocket expense if they have not yet received this vaccine. Advised may also receive vaccine at local pharmacy or Health Dept. Verbalized acceptance and understanding.  Screening Tests Health Maintenance  Topic Date Due  . DTAP VACCINES (1) Never done  . COVID-19 Vaccine (1) Never done  . DTaP/Tdap/Td (1 - Tdap) Never done  . TETANUS/TDAP  03/25/2024 (Originally 05/15/1965)  . INFLUENZA VACCINE  03/28/2020  . COLONOSCOPY  06/15/2022  . Hepatitis C Screening  Completed  . PNA vac Low Risk Adult  Completed    Health Maintenance  Health Maintenance Due  Topic Date Due  . DTAP VACCINES (1) Never done  . COVID-19 Vaccine (1) Never done  . DTaP/Tdap/Td (1 - Tdap) Never done    Colorectal cancer screening: Completed 06/16/2019. Repeat every 3 years  Lung Cancer Screening: (Low Dose CT Chest recommended if Age 57-80 years, 30 pack-year currently smoking OR have quit w/in 15 years.) does not qualify.    Additional Screening:  Hepatitis C Screening: does qualify; Completed 11/10/2015  Vision Screening: Recommended annual ophthalmology exams for early detection of glaucoma and other disorders of the eye. Is the patient up to date with their annual eye exam?  Yes  Who is the provider or what is the name of the office in which the patient attends annual eye exams? [facility] If pt is not established with a provider, would they like to be  referred to a provider to establish care? No .   Dental Screening: Recommended annual dental exams for proper oral hygiene  Community Resource Referral / Chronic Care Management: CRR required this visit?  No   CCM required this visit?  No      Plan:     I have personally reviewed and noted the following in the patient's chart:   . Medical and social history . Use of alcohol, tobacco or illicit drugs  . Current medications and supplements . Functional ability and status . Nutritional status . Physical activity . Advanced directives . List of other physicians . Hospitalizations, surgeries, and ER visits in previous 12 months . Vitals . Screenings to include cognitive, depression, and falls . Referrals and appointments  In addition, I have reviewed and discussed with patient certain preventive protocols, quality metrics, and best practice recommendations. A written personalized care plan for preventive services as well as general preventive health recommendations were provided to patient.   Due to this being a telephonic visit, the after visit summary with patients personalized plan was offered to patient via mail or my-chart. Patient preferred to pick up at office at next visit.   Andrez Grime, LPN   [id number]

## 2020-03-25 NOTE — Patient Instructions (Signed)
Gregory Colon , Thank you for taking time to come for your Medicare Wellness Visit. I appreciate your ongoing commitment to your health goals. Please review the following plan we discussed and let me know if I can assist you in the future.   Screening recommendations/referrals: Colonoscopy: Up to date, completed 06/16/2019, due 05/2022 Recommended yearly ophthalmology/optometry visit for glaucoma screening and checkup Recommended yearly dental visit for hygiene and checkup  Vaccinations: Influenza vaccine: Up to date, completed 06/23/2019, due 03/2020 Pneumococcal vaccine: Completed series Tdap vaccine: decline- insurance/financial Shingles vaccine: due, check with your insurance regarding coverage   Covid-19: Completed series  Advanced directives: Advance directive discussed with you today. Even though you declined this today please call our office should you change your mind and we can give you the proper paperwork for you to fill out.   Conditions/risks identified: hypertension, hyperlipidemia  Next appointment: Follow up in one year for your annual wellness visit.   Preventive Care 75 Years and Older, Male Preventive care refers to lifestyle choices and visits with your health care provider that can promote health and wellness. What does preventive care include?  A yearly physical exam. This is also called an annual well check.  Dental exams once or twice a year.  Routine eye exams. Ask your health care provider how often you should have your eyes checked.  Personal lifestyle choices, including:  Daily care of your teeth and gums.  Regular physical activity.  Eating a healthy diet.  Avoiding tobacco and drug use.  Limiting alcohol use.  Practicing safe sex.  Taking low doses of aspirin every day.  Taking vitamin and mineral supplements as recommended by your health care provider. What happens during an annual well check? The services and screenings done by your  health care provider during your annual well check will depend on your age, overall health, lifestyle risk factors, and family history of disease. Counseling  Your health care provider may ask you questions about your:  Alcohol use.  Tobacco use.  Drug use.  Emotional well-being.  Home and relationship well-being.  Sexual activity.  Eating habits.  History of falls.  Memory and ability to understand (cognition).  Work and work Statistician. Screening  You may have the following tests or measurements:  Height, weight, and BMI.  Blood pressure.  Lipid and cholesterol levels. These may be checked every 5 years, or more frequently if you are over 75 years old.  Skin check.  Lung cancer screening. You may have this screening every year starting at age 75 if you have a 30-pack-year history of smoking and currently smoke or have quit within the past 15 years.  Fecal occult blood test (FOBT) of the stool. You may have this test every year starting at age 75.  Flexible sigmoidoscopy or colonoscopy. You may have a sigmoidoscopy every 5 years or a colonoscopy every 10 years starting at age 75.  Prostate cancer screening. Recommendations will vary depending on your family history and other risks.  Hepatitis C blood test.  Hepatitis B blood test.  Sexually transmitted disease (STD) testing.  Diabetes screening. This is done by checking your blood sugar (glucose) after you have not eaten for a while (fasting). You may have this done every 1-3 years.  Abdominal aortic aneurysm (AAA) screening. You may need this if you are a current or former smoker.  Osteoporosis. You may be screened starting at age 48 if you are at high risk. Talk with your health care provider about your  test results, treatment options, and if necessary, the need for more tests. Vaccines  Your health care provider may recommend certain vaccines, such as:  Influenza vaccine. This is recommended every  year.  Tetanus, diphtheria, and acellular pertussis (Tdap, Td) vaccine. You may need a Td booster every 10 years.  Zoster vaccine. You may need this after age 75.  Pneumococcal 13-valent conjugate (PCV13) vaccine. One dose is recommended after age 75.  Pneumococcal polysaccharide (PPSV23) vaccine. One dose is recommended after age 75. Talk to your health care provider about which screenings and vaccines you need and how often you need them. This information is not intended to replace advice given to you by your health care provider. Make sure you discuss any questions you have with your health care provider. Document Released: 09/10/2015 Document Revised: 05/03/2016 Document Reviewed: 06/15/2015 Elsevier Interactive Patient Education  2017 [employer] Prevention in the Home Falls can cause injuries. They can happen to people of all ages. There are many things you can do to make your home safe and to help prevent falls. What can I do on the outside of my home?  Regularly fix the edges of walkways and driveways and fix any cracks.  Remove anything that might make you trip as you walk through a door, such as a raised step or threshold.  Trim any bushes or trees on the path to your home.  Use bright outdoor lighting.  Clear any walking paths of anything that might make someone trip, such as rocks or tools.  Regularly check to see if handrails are loose or broken. Make sure that both sides of any steps have handrails.  Any raised decks and porches should have guardrails on the edges.  Have any leaves, snow, or ice cleared regularly.  Use sand or salt on walking paths during winter.  Clean up any spills in your garage right away. This includes oil or grease spills. What can I do in the bathroom?  Use night lights.  Install grab bars by the toilet and in the tub and shower. Do not use towel bars as grab bars.  Use non-skid mats or decals in the tub or shower.  If you  need to sit down in the shower, use a plastic, non-slip stool.  Keep the floor dry. Clean up any water that spills on the floor as soon as it happens.  Remove soap buildup in the tub or shower regularly.  Attach bath mats securely with double-sided non-slip rug tape.  Do not have throw rugs and other things on the floor that can make you trip. What can I do in the bedroom?  Use night lights.  Make sure that you have a light by your bed that is easy to reach.  Do not use any sheets or blankets that are too big for your bed. They should not hang down onto the floor.  Have a firm chair that has side arms. You can use this for support while you get dressed.  Do not have throw rugs and other things on the floor that can make you trip. What can I do in the kitchen?  Clean up any spills right away.  Avoid walking on wet floors.  Keep items that you use a lot in easy-to-reach places.  If you need to reach something above you, use a strong step stool that has a grab bar.  Keep electrical cords out of the way.  Do not use floor polish or wax that  makes floors slippery. If you must use wax, use non-skid floor wax.  Do not have throw rugs and other things on the floor that can make you trip. What can I do with my stairs?  Do not leave any items on the stairs.  Make sure that there are handrails on both sides of the stairs and use them. Fix handrails that are broken or loose. Make sure that handrails are as long as the stairways.  Check any carpeting to make sure that it is firmly attached to the stairs. Fix any carpet that is loose or worn.  Avoid having throw rugs at the top or bottom of the stairs. If you do have throw rugs, attach them to the floor with carpet tape.  Make sure that you have a light switch at the top of the stairs and the bottom of the stairs. If you do not have them, ask someone to add them for you. What else can I do to help prevent falls?  Wear shoes  that:  Do not have high heels.  Have rubber bottoms.  Are comfortable and fit you well.  Are closed at the toe. Do not wear sandals.  If you use a stepladder:  Make sure that it is fully opened. Do not climb a closed stepladder.  Make sure that both sides of the stepladder are locked into place.  Ask someone to hold it for you, if possible.  Clearly mark and make sure that you can see:  Any grab bars or handrails.  First and last steps.  Where the edge of each step is.  Use tools that help you move around (mobility aids) if they are needed. These include:  Canes.  Walkers.  Scooters.  Crutches.  Turn on the lights when you go into a dark area. Replace any light bulbs as soon as they burn out.  Set up your furniture so you have a clear path. Avoid moving your furniture around.  If any of your floors are uneven, fix them.  If there are any pets around you, be aware of where they are.  Review your medicines with your doctor. Some medicines can make you feel dizzy. This can increase your chance of falling. Ask your doctor what other things that you can do to help prevent falls. This information is not intended to replace advice given to you by your health care provider. Make sure you discuss any questions you have with your health care provider. Document Released: 06/10/2009 Document Revised: 01/20/2016 Document Reviewed: 09/18/2014 Elsevier Interactive Patient Education  2017 [employer].

## 2020-03-25 NOTE — Progress Notes (Signed)
PCP notes:  Health Maintenance: Tdap- insurance/financial Will bring covid vaccine card to physical so we can document dates in chart   Abnormal Screenings: none   Patient concerns: none   Nurse concerns: none   Next PCP appt.: 03/26/2020 @ 7:30 am

## 2020-03-26 ENCOUNTER — Ambulatory Visit (INDEPENDENT_AMBULATORY_CARE_PROVIDER_SITE_OTHER): Payer: Medicare Other | Admitting: Family Medicine

## 2020-03-26 ENCOUNTER — Other Ambulatory Visit: Payer: Self-pay

## 2020-03-26 ENCOUNTER — Encounter: Payer: Self-pay | Admitting: Family Medicine

## 2020-03-26 VITALS — BP 142/88 | HR 75 | Temp 97.2°F | Ht 67.75 in | Wt 177.1 lb

## 2020-03-26 DIAGNOSIS — I10 Essential (primary) hypertension: Secondary | ICD-10-CM | POA: Diagnosis not present

## 2020-03-26 DIAGNOSIS — Z125 Encounter for screening for malignant neoplasm of prostate: Secondary | ICD-10-CM | POA: Diagnosis not present

## 2020-03-26 DIAGNOSIS — E785 Hyperlipidemia, unspecified: Secondary | ICD-10-CM | POA: Diagnosis not present

## 2020-03-26 DIAGNOSIS — I714 Abdominal aortic aneurysm, without rupture, unspecified: Secondary | ICD-10-CM

## 2020-03-26 DIAGNOSIS — Z87891 Personal history of nicotine dependence: Secondary | ICD-10-CM | POA: Diagnosis not present

## 2020-03-26 DIAGNOSIS — J432 Centrilobular emphysema: Secondary | ICD-10-CM

## 2020-03-26 DIAGNOSIS — Z0000 Encounter for general adult medical examination without abnormal findings: Secondary | ICD-10-CM

## 2020-03-26 DIAGNOSIS — R7303 Prediabetes: Secondary | ICD-10-CM | POA: Diagnosis not present

## 2020-03-26 DIAGNOSIS — R011 Cardiac murmur, unspecified: Secondary | ICD-10-CM

## 2020-03-26 DIAGNOSIS — I2510 Atherosclerotic heart disease of native coronary artery without angina pectoris: Secondary | ICD-10-CM

## 2020-03-26 LAB — LIPID PANEL
Cholesterol: 136 mg/dL (ref 0–200)
HDL: 35.9 mg/dL — ABNORMAL LOW (ref >39.00)
LDL Cholesterol: 71 mg/dL (ref 0–99)
NonHDL: 100.18
Total CHOL/HDL Ratio: 4
Triglycerides: 146 mg/dL (ref 0.0–149.0)
VLDL: 29.2 mg/dL (ref 0.0–40.0)

## 2020-03-26 LAB — POC URINALSYSI DIPSTICK (AUTOMATED)
Bilirubin, UA: NEGATIVE
Blood, UA: NEGATIVE
Glucose, UA: NEGATIVE
Ketones, UA: NEGATIVE
Leukocytes, UA: NEGATIVE
Nitrite, UA: NEGATIVE
Protein, UA: NEGATIVE
Spec Grav, UA: >=1.03 — AB (ref 1.010–1.025)
Urobilinogen, UA: 0.2 E.U./dL
pH, UA: 6 (ref 5.0–8.0)

## 2020-03-26 LAB — HEMOGLOBIN A1C: Hgb A1c MFr Bld: 6 % (ref 4.6–6.5)

## 2020-03-26 LAB — COMPREHENSIVE METABOLIC PANEL
ALT: 22 U/L (ref 0–53)
AST: 17 U/L (ref 0–37)
Albumin: 4.4 g/dL (ref 3.5–5.2)
Alkaline Phosphatase: 78 U/L (ref 39–117)
BUN: 9 mg/dL (ref 6–23)
CO2: 28 mEq/L (ref 19–32)
Calcium: 9.9 mg/dL (ref 8.4–10.5)
Chloride: 104 mEq/L (ref 96–112)
Creatinine, Ser: 1.09 mg/dL (ref 0.40–1.50)
GFR: 65.97 mL/min (ref >60.00)
Glucose, Bld: 104 mg/dL — ABNORMAL HIGH (ref 70–99)
Potassium: 4.4 mEq/L (ref 3.5–5.1)
Sodium: 139 mEq/L (ref 135–145)
Total Bilirubin: 0.8 mg/dL (ref 0.2–1.2)
Total Protein: 7.1 g/dL (ref 6.0–8.3)

## 2020-03-26 LAB — PSA, MEDICARE: PSA: 0.91 ng/ml (ref 0.10–4.00)

## 2020-03-26 LAB — MICROALBUMIN / CREATININE URINE RATIO
Creatinine,U: 192.2 mg/dL
Microalb Creat Ratio: 0.6 mg/g (ref 0.0–30.0)
Microalb, Ur: 1.2 mg/dL (ref 0.0–1.9)

## 2020-03-26 MED ORDER — ATORVASTATIN CALCIUM 40 MG PO TABS: 40.0000 mg | ORAL_TABLET | Freq: Every day | ORAL | 3 refills | Status: DC

## 2020-03-26 MED ORDER — AMLODIPINE BESYLATE 5 MG PO TABS: 5.0000 mg | ORAL_TABLET | Freq: Every day | ORAL | 3 refills | Status: DC

## 2020-03-26 NOTE — Assessment & Plan Note (Signed)
Chronic, continues lipitor. Update FLP The ASCVD Risk score Gregory Bussing DC Jr., et al., 2013) failed to calculate for the following reasons:   The valid total cholesterol range is 130 to 320 mg/dL

## 2020-03-26 NOTE — Patient Instructions (Addendum)
We will order abdominal ultrasound to monitor abdominal aortic dilation previously noted.  Labs today Urine test today.  Medicines refilled today.  You are doing well today - continue current medicines Return as needed or in 1 year for next physical  Health Maintenance After Age 75 After age 57, you are at a higher risk for certain long-term diseases and infections as well as injuries from falls. Falls are a major cause of broken bones and head injuries in people who are older than age 38. Getting regular preventive care can help to keep you healthy and well. Preventive care includes getting regular testing and making lifestyle changes as recommended by your health care provider. Talk with your health care provider about:  Which screenings and tests you should have. A screening is a test that checks for a disease when you have no symptoms.  A diet and exercise plan that is right for you. What should I know about screenings and tests to prevent falls? Screening and testing are the best ways to find a health problem early. Early diagnosis and treatment give you the best chance of managing medical conditions that are common after age 37. Certain conditions and lifestyle choices may make you more likely to have a fall. Your health care provider may recommend:  Regular vision checks. Poor vision and conditions such as cataracts can make you more likely to have a fall. If you wear glasses, make sure to get your prescription updated if your vision changes.  Medicine review. Work with your health care provider to regularly review all of the medicines you are taking, including over-the-counter medicines. Ask your health care provider about any side effects that may make you more likely to have a fall. Tell your health care provider if any medicines that you take make you feel dizzy or sleepy.  Osteoporosis screening. Osteoporosis is a condition that causes the bones to get weaker. This can make the bones  weak and cause them to break more easily.  Blood pressure screening. Blood pressure changes and medicines to control blood pressure can make you feel dizzy.  Strength and balance checks. Your health care provider may recommend certain tests to check your strength and balance while standing, walking, or changing positions.  Foot health exam. Foot pain and numbness, as well as not wearing proper footwear, can make you more likely to have a fall.  Depression screening. You may be more likely to have a fall if you have a fear of falling, feel emotionally low, or feel unable to do activities that you used to do.  Alcohol use screening. Using too much alcohol can affect your balance and may make you more likely to have a fall. What actions can I take to lower my risk of falls? General instructions  Talk with your health care provider about your risks for falling. Tell your health care provider if: ? You fall. Be sure to tell your health care provider about all falls, even ones that seem minor. ? You feel dizzy, sleepy, or off-balance.  Take over-the-counter and prescription medicines only as told by your health care provider. These include any supplements.  Eat a healthy diet and maintain a healthy weight. A healthy diet includes low-fat dairy products, low-fat (lean) meats, and fiber from whole grains, beans, and lots of fruits and vegetables. Home safety  Remove any tripping hazards, such as rugs, cords, and clutter.  Install safety equipment such as grab bars in bathrooms and safety rails on stairs.  Keep rooms and walkways well-lit. Activity   Follow a regular exercise program to stay fit. This will help you maintain your balance. Ask your health care provider what types of exercise are appropriate for you.  If you need a cane or walker, use it as recommended by your health care provider.  Wear supportive shoes that have nonskid soles. Lifestyle  Do not drink alcohol if your  health care provider tells you not to drink.  If you drink alcohol, limit how much you have: ? 0-1 drink a day for women. ? 0-2 drinks a day for men.  Be aware of how much alcohol is in your drink. In the [place] one drink equals one typical bottle of beer (12 oz), one-half glass of wine (5 oz), or one shot of hard liquor (1 oz).  Do not use any products that contain nicotine or tobacco, such as cigarettes and e-cigarettes. If you need help quitting, ask your health care provider. Summary  Having a healthy lifestyle and getting preventive care can help to protect your health and wellness after age 4.  Screening and testing are the best way to find a health problem early and help you avoid having a fall. Early diagnosis and treatment give you the best chance for managing medical conditions that are more common for people who are older than age 73.  Falls are a major cause of broken bones and head injuries in people who are older than age 67. Take precautions to prevent a fall at home.  Work with your health care provider to learn what changes you can make to improve your health and wellness and to prevent falls. This information is not intended to replace advice given to you by your health care provider. Make sure you discuss any questions you have with your health care provider. Document Revised: 12/05/2018 Document Reviewed: 06/27/2017 Elsevier Patient Education  2020 [employer].

## 2020-03-26 NOTE — Assessment & Plan Note (Signed)
Chronic, stable. Continue current regimen. 

## 2020-03-26 NOTE — Assessment & Plan Note (Signed)
Update imaging.

## 2020-03-26 NOTE — Assessment & Plan Note (Signed)
Asxs. Quit smoking 2 yrs ago.

## 2020-03-26 NOTE — Assessment & Plan Note (Signed)
Continue statin. 

## 2020-03-26 NOTE — Assessment & Plan Note (Signed)
Update A1c ?

## 2020-03-26 NOTE — Assessment & Plan Note (Signed)
Newly noted, mild. Will monitor for now.

## 2020-03-26 NOTE — Assessment & Plan Note (Signed)
Remains abstinent 

## 2020-03-26 NOTE — Progress Notes (Addendum)
This visit was conducted in person.  BP (!) 142/88   Pulse 75   Temp (!) 97.2 F (36.2 C) (Temporal)   Ht 5' 7.75" (1.721 m)   Wt 177 lb 1 oz (80.3 kg)   SpO2 97%   BMI 27.12 kg/m    CC: CPE Subjective:    Patient ID: Gregory Colon, male    DOB: 1946/07/28, 74 y.o.   MRN: [id number]  HPI: Gregory Colon is a 74 y.o. male presenting on 03/26/2020 for Medicare Wellness (part 2 )   Saw health advisor this week for medicare wellness visit. Note reviewed.   No exam data present    Clinical Support from 03/25/2020 in [place] at [facility] Total Score 0      Fall Risk  03/25/2020 03/21/2019 03/15/2018 03/08/2017 03/02/2016  Falls in the past year? 0 0 No No No  Number falls in past yr: 0 0 - - -  Injury with Fall? 0 0 - - -  Risk for fall due to : Medication side effect - - - -  Follow up Falls evaluation completed;Falls prevention discussed - - - -    H/o mild dilatation of infrarenal AA 3cm (12/2014). rec rpt 5 yrs  L [facility] OA (seronegative arthritis vs crystalline arthropathy)followed by Dr Verne Carrow. He has had 2 steroid injections into left wrist. He is not taking meloxicam.  COPD - found on CT lungs 2016 - denies dyspnea, wheezing or productive cough. Wears mask when he mows.   Preventative: COLONOSCOPY WITH PROPOFOL 06/16/2019 - TA x4, no f/u recommended Vicente Males, Bailey Mech, MD) Prostate cancer screening - no fmhx. Declines DRE. Would like to continue PSA Lung cancer screening - completed 10/2014. Desires to defer rpt CT for now. Flu yearly [facility]  Moderna vaccine - completed 12/2019 Prevnar 07/2014, pneumovax7/2017  Shingrix -discussed, declines  Advanced directive - would want daughter Cristy to be HCPOA. Daughter knows what he wants. Doesn't want prolonged life support. Not interested in setting up.Declines packet today. Seat belt use discussed  Sunscreen use discussed. No changing moles on skin. Ex smoker - quit 2019. 50+ yrs  smoking.  Alcohol - none  Dentist - does not see - has dentures Eye exam - yearly at [facility] - no diarrhea/constipation Bladder - no incontinence  Lives alone, divorced, father of [name] Occupation: retired, was Pension scheme manager Activity: yardwork - cutting woodand mowing lawn, walking in evenings Diet: some water, fruits/vegetables some      Relevant past medical, surgical, family and social history reviewed and updated as indicated. Interim medical history since our last visit reviewed. Allergies and medications reviewed and updated. Outpatient Medications Prior to Visit  Medication Sig Dispense Refill  . Multiple Vitamins-Minerals (MULTIVITAMIN ADULTS 50+ PO) Take 1 tablet by mouth daily.    Marland Kitchen amLODipine (NORVASC) 5 MG tablet Take 1 tablet (5 mg total) by mouth daily. 90 tablet 3  . atorvastatin (LIPITOR) 40 MG tablet Take 1 tablet (40 mg total) by mouth daily. 90 tablet 3   No facility-administered medications prior to visit.     Per HPI unless specifically indicated in ROS section below Review of Systems  Constitutional: Negative for activity change, appetite change, chills, fatigue, fever and unexpected weight change.  HENT: Negative for hearing loss.   Eyes: Negative for visual disturbance.  Respiratory: Negative for cough, chest tightness, shortness of breath and wheezing.   Cardiovascular: Negative for chest pain, palpitations and leg swelling.  Gastrointestinal: Negative for abdominal distention, abdominal pain, blood in stool, constipation, diarrhea, nausea and vomiting.  Genitourinary: Negative for difficulty urinating and hematuria.  Musculoskeletal: Negative for arthralgias, myalgias and neck pain.  Skin: Negative for rash.  Neurological: Negative for dizziness, seizures, syncope and headaches.  Hematological: Negative for adenopathy. Bruises/bleeds easily.  Psychiatric/Behavioral: Negative for dysphoric mood. The patient is not  nervous/anxious.    Objective:  BP (!) 142/88   Pulse 75   Temp (!) 97.2 F (36.2 C) (Temporal)   Ht 5' 7.75" (1.721 m)   Wt 177 lb 1 oz (80.3 kg)   SpO2 97%   BMI 27.12 kg/m   Wt Readings from Last 3 Encounters:  03/26/20 177 lb 1 oz (80.3 kg)  06/16/19 178 lb (80.7 kg)  03/21/19 170 lb (77.1 kg)      Physical Exam Vitals and nursing note reviewed.  Constitutional:      General: He is not in acute distress.    Appearance: Normal appearance. He is well-developed. He is not ill-appearing.  HENT:     Head: Normocephalic and atraumatic.     Right Ear: Hearing, tympanic membrane, ear canal and external ear normal.     Left Ear: Hearing, tympanic membrane, ear canal and external ear normal.  Eyes:     General: No scleral icterus.    Extraocular Movements: Extraocular movements intact.     Conjunctiva/sclera: Conjunctivae normal.     Pupils: Pupils are equal, round, and reactive to light.  Neck:     Thyroid: No thyroid mass, thyromegaly or thyroid tenderness.     Vascular: No carotid bruit.  Cardiovascular:     Rate and Rhythm: Normal rate and regular rhythm.     Pulses: Normal pulses.          Radial pulses are 2+ on the right side and 2+ on the left side.     Heart sounds: Murmur (2/6 systolic best at RUSB) heard.   Pulmonary:     Effort: Pulmonary effort is normal. No respiratory distress.     Breath sounds: Normal breath sounds. No wheezing, rhonchi or rales.  Abdominal:     General: Abdomen is flat. Bowel sounds are normal. There is no distension.     Palpations: Abdomen is soft. There is no mass.     Tenderness: There is no abdominal tenderness. There is no guarding or rebound.     Hernia: No hernia is present.  Musculoskeletal:        General: Normal range of motion.     Cervical back: Normal range of motion and neck supple.     Right lower leg: No edema.     Left lower leg: No edema.  Lymphadenopathy:     Cervical: No cervical adenopathy.  Skin:    General:  Skin is warm and dry.     Findings: No rash.  Neurological:     General: No focal deficit present.     Mental Status: He is alert and oriented to person, place, and time.     Comments: CN grossly intact, station and gait intact  Psychiatric:        Mood and Affect: Mood normal.        Behavior: Behavior normal.        Thought Content: Thought content normal.        Judgment: Judgment normal.       Results for orders placed or performed in visit on 03/26/20  POCT Urinalysis Dipstick (Automated)  Result Value Ref Range   Color, UA dark yellow    Clarity, UA cloudy    Glucose, UA Negative Negative   Bilirubin, UA negative    Ketones, UA negative    Spec Grav, UA >=1.030 (A) 1.010 - 1.025   Blood, UA negative    pH, UA 6.0 5.0 - 8.0   Protein, UA Negative Negative   Urobilinogen, UA 0.2 0.2 or 1.0 E.U./dL   Nitrite, UA negative    Leukocytes, UA Negative Negative   Assessment & Plan:  This visit occurred during the SARS-CoV-2 public health emergency.  Safety protocols were in place, including screening questions prior to the visit, additional usage of staff PPE, and extensive cleaning of exam room while observing appropriate contact time as indicated for disinfecting solutions.   Problem List Items Addressed This Visit    Systolic murmur    Newly noted, mild. Will monitor for now.       Prediabetes    Update A1c.       Relevant Orders   Hemoglobin A1c   POCT Urinalysis Dipstick (Automated) (Completed)   Health maintenance examination - Primary    Preventative protocols reviewed and updated unless pt declined. Discussed healthy diet and lifestyle.       Ex-smoker    Remains abstinent.       Emphysema of lung ([place])    Asxs. Quit smoking 2 yrs ago.       Dyslipidemia    Chronic, continues lipitor. Update FLP The ASCVD Risk score Mikey Bussing DC Jr., et al., 2013) failed to calculate for the following reasons:   The valid total cholesterol range is 130 to 320 mg/dL        Relevant Medications   atorvastatin (LIPITOR) 40 MG tablet   Other Relevant Orders   Lipid panel   Comprehensive metabolic panel   CAD (coronary artery disease)    Continue statin.       Relevant Medications   amLODipine (NORVASC) 5 MG tablet   atorvastatin (LIPITOR) 40 MG tablet   Benign essential HTN    Chronic, stable. Continue current regimen.       Relevant Medications   amLODipine (NORVASC) 5 MG tablet   atorvastatin (LIPITOR) 40 MG tablet   Other Relevant Orders   Microalbumin / creatinine urine ratio   AAA (abdominal aortic aneurysm) (HCC)    Update imaging.       Relevant Medications   amLODipine (NORVASC) 5 MG tablet   atorvastatin (LIPITOR) 40 MG tablet   Other Relevant Orders   VAS [place] AAA DUPLEX    Other Visit Diagnoses    Special screening for malignant neoplasm of prostate       Relevant Orders   PSA, Medicare       Meds ordered this encounter  Medications  . amLODipine (NORVASC) 5 MG tablet    Sig: Take 1 tablet (5 mg total) by mouth daily.    Dispense:  90 tablet    Refill:  3  . atorvastatin (LIPITOR) 40 MG tablet    Sig: Take 1 tablet (40 mg total) by mouth daily.    Dispense:  90 tablet    Refill:  3   Orders Placed This Encounter  Procedures  . Lipid panel  . Comprehensive metabolic panel  . Hemoglobin A1c  . Microalbumin / creatinine urine ratio  . PSA, Medicare  . POCT Urinalysis Dipstick (Automated)    Patient instructions: We will order abdominal ultrasound to monitor abdominal aortic  dilation previously noted.  Labs today Urine test today.  Medicines refilled today.  You are doing well today - continue current medicines Return as needed or in 1 year for next physical  Follow up plan: Return in about 1 year (around 03/26/2021) for annual exam, prior fasting for blood work, medicare wellness visit.  Gregory Bush, MD

## 2020-03-26 NOTE — Assessment & Plan Note (Signed)
Preventative protocols reviewed and updated unless pt declined. Discussed healthy diet and lifestyle.  

## 2020-04-19 ENCOUNTER — Ambulatory Visit (INDEPENDENT_AMBULATORY_CARE_PROVIDER_SITE_OTHER): Payer: Medicare Other

## 2020-04-19 ENCOUNTER — Other Ambulatory Visit: Payer: Self-pay

## 2020-04-19 DIAGNOSIS — I714 Abdominal aortic aneurysm, without rupture, unspecified: Secondary | ICD-10-CM

## 2020-04-23 ENCOUNTER — Encounter: Payer: Self-pay | Admitting: Family Medicine

## 2020-07-13 ENCOUNTER — Other Ambulatory Visit: Payer: Self-pay

## 2020-07-13 ENCOUNTER — Ambulatory Visit (INDEPENDENT_AMBULATORY_CARE_PROVIDER_SITE_OTHER): Payer: Medicare Other | Admitting: Family Medicine

## 2020-07-13 ENCOUNTER — Encounter: Payer: Self-pay | Admitting: Family Medicine

## 2020-07-13 DIAGNOSIS — I10 Essential (primary) hypertension: Secondary | ICD-10-CM | POA: Diagnosis not present

## 2020-07-13 MED ORDER — LISINOPRIL 2.5 MG PO TABS: 2.5000 mg | ORAL_TABLET | Freq: Every day | ORAL | 6 refills | Status: DC

## 2020-07-13 NOTE — Assessment & Plan Note (Signed)
Chronic, BP trending up at home but overall adequate.  Reviewed low sodium high potassium diet.  Pt interested in additional treatment to control blood pressures. Will start lisinopril 2.5mg  daily. Reviewed side effects/allegy to watch for on ACEI. Discussed watching for hypotension. Update with effect.

## 2020-07-13 NOTE — Patient Instructions (Addendum)
Blood pressure is staying a bit elevated.  Let's add lisinopril 2.5mg  daily in the mornings in addition to your amlodipine.  Continue watching blood pressures, let us know if not getting to goal on new medicine. Watch for dry cough or tongue/lip swelling as possible side effect/allergy to this medicine, let us know if that happens.   DASH Eating Plan DASH stands for "Dietary Approaches to Stop Hypertension." The DASH eating plan is a healthy eating plan that has been shown to reduce high blood pressure (hypertension). It may also reduce your risk for type 2 diabetes, heart disease, and stroke. The DASH eating plan may also help with weight loss. What are tips for following this plan?  General guidelines  Avoid eating more than 2,300 mg (milligrams) of salt (sodium) a day. If you have hypertension, you may need to reduce your sodium intake to 1,500 mg a day.  Limit alcohol intake to no more than 1 drink a day for nonpregnant women and 2 drinks a day for men. One drink equals 12 oz of beer, 5 oz of wine, or 1 oz of hard liquor.  Work with your health care provider to maintain a healthy body weight or to lose weight. Ask what an ideal weight is for you.  Get at least 30 minutes of exercise that causes your heart to beat faster (aerobic exercise) most days of the week. Activities may include walking, swimming, or biking.  Work with your health care provider or diet and nutrition specialist (dietitian) to adjust your eating plan to your individual calorie needs. Reading food labels   Check food labels for the amount of sodium per serving. Choose foods with less than 5 percent of the Daily Value of sodium. Generally, foods with less than 300 mg of sodium per serving fit into this eating plan.  To find whole grains, look for the word "whole" as the first word in the ingredient list. Shopping  Buy products labeled as "low-sodium" or "no salt added."  Buy fresh foods. Avoid canned foods and  premade or frozen meals. Cooking  Avoid adding salt when cooking. Use salt-free seasonings or herbs instead of table salt or sea salt. Check with your health care provider or pharmacist before using salt substitutes.  Do not fry foods. Cook foods using healthy methods such as baking, boiling, grilling, and broiling instead.  Cook with heart-healthy oils, such as olive, canola, soybean, or sunflower oil. Meal planning  Eat a balanced diet that includes: ? 5 or more servings of fruits and vegetables each day. At each meal, try to fill half of your plate with fruits and vegetables. ? Up to 6-8 servings of whole grains each day. ? Less than 6 oz of lean meat, poultry, or fish each day. A 3-oz serving of meat is about the same size as a deck of cards. One egg equals 1 oz. ? 2 servings of low-fat dairy each day. ? A serving of nuts, seeds, or beans 5 times each week. ? Heart-healthy fats. Healthy fats called Omega-3 fatty acids are found in foods such as flaxseeds and coldwater fish, like sardines, salmon, and mackerel.  Limit how much you eat of the following: ? Canned or prepackaged foods. ? Food that is high in trans fat, such as fried foods. ? Food that is high in saturated fat, such as fatty meat. ? Sweets, desserts, sugary drinks, and other foods with added sugar. ? Full-fat dairy products.  Do not salt foods before eating.  Try  to eat at least 2 vegetarian meals each week.  Eat more home-cooked food and less restaurant, buffet, and fast food.  When eating at a restaurant, ask that your food be prepared with less salt or no salt, if possible. What foods are recommended? The items listed may not be a complete list. Talk with your dietitian about what dietary choices are best for you. Grains Whole-grain or whole-wheat bread. Whole-grain or whole-wheat pasta. Brown rice. [place] Morrow. Bulgur. Whole-grain and low-sodium cereals. Pita bread. Low-fat, low-sodium crackers. Whole-wheat  flour tortillas. Vegetables Fresh or frozen vegetables (raw, steamed, roasted, or grilled). Low-sodium or reduced-sodium tomato and vegetable juice. Low-sodium or reduced-sodium tomato sauce and tomato paste. Low-sodium or reduced-sodium canned vegetables. Fruits All fresh, dried, or frozen fruit. Canned fruit in natural juice (without added sugar). Meat and other protein foods Skinless chicken or [place]. Ground chicken or [place]. Pork with fat trimmed off. Fish and seafood. Egg whites. Dried beans, peas, or lentils. Unsalted nuts, nut butters, and seeds. Unsalted canned beans. Lean cuts of beef with fat trimmed off. Low-sodium, lean deli meat. Dairy Low-fat (1%) or fat-free (skim) milk. Fat-free, low-fat, or reduced-fat cheeses. Nonfat, low-sodium ricotta or cottage cheese. Low-fat or nonfat yogurt. Low-fat, low-sodium cheese. Fats and oils Soft margarine without trans fats. Vegetable oil. Low-fat, reduced-fat, or light mayonnaise and salad dressings (reduced-sodium). Canola, safflower, olive, soybean, and sunflower oils. Avocado. Seasoning and other foods Herbs. Spices. Seasoning mixes without salt. Unsalted popcorn and pretzels. Fat-free sweets. What foods are not recommended? The items listed may not be a complete list. Talk with your dietitian about what dietary choices are best for you. Grains Baked goods made with fat, such as croissants, muffins, or some breads. Dry pasta or rice meal packs. Vegetables Creamed or fried vegetables. Vegetables in a cheese sauce. Regular canned vegetables (not low-sodium or reduced-sodium). Regular canned tomato sauce and paste (not low-sodium or reduced-sodium). Regular tomato and vegetable juice (not low-sodium or reduced-sodium). Angie Fava. Olives. Fruits Canned fruit in a light or heavy syrup. Fried fruit. Fruit in cream or butter sauce. Meat and other protein foods Fatty cuts of meat. Ribs. Fried meat. Berniece Salines. Sausage. Bologna and other processed lunch  meats. Salami. Fatback. Hotdogs. Bratwurst. Salted nuts and seeds. Canned beans with added salt. Canned or smoked fish. Whole eggs or egg yolks. Chicken or [place] with skin. Dairy Whole or 2% milk, cream, and half-and-half. Whole or full-fat cream cheese. Whole-fat or sweetened yogurt. Full-fat cheese. Nondairy creamers. Whipped toppings. Processed cheese and cheese spreads. Fats and oils Butter. Stick margarine. Lard. Shortening. Ghee. Bacon fat. Tropical oils, such as coconut, palm kernel, or palm oil. Seasoning and other foods Salted popcorn and pretzels. Onion salt, garlic salt, seasoned salt, table salt, and sea salt. Worcestershire sauce. Tartar sauce. Barbecue sauce. Teriyaki sauce. Soy sauce, including reduced-sodium. Steak sauce. Canned and packaged gravies. Fish sauce. Oyster sauce. Cocktail sauce. Horseradish that you find on the shelf. Ketchup. Mustard. Meat flavorings and tenderizers. Bouillon cubes. Hot sauce and Tabasco sauce. Premade or packaged marinades. Premade or packaged taco seasonings. Relishes. Regular salad dressings. Where to find more information:  National Heart, Lung, and [place]: https://wilson-eaton.com/  American Heart Association: www.heart.org Summary  The DASH eating plan is a healthy eating plan that has been shown to reduce high blood pressure (hypertension). It may also reduce your risk for type 2 diabetes, heart disease, and stroke.  With the DASH eating plan, you should limit salt (sodium) intake to 2,300 mg a day. If you  have hypertension, you may need to reduce your sodium intake to 1,500 mg a day.  When on the DASH eating plan, aim to eat more fresh fruits and vegetables, whole grains, lean proteins, low-fat dairy, and heart-healthy fats.  Work with your health care provider or diet and nutrition specialist (dietitian) to adjust your eating plan to your individual calorie needs. This information is not intended to replace advice given to you by your  health care provider. Make sure you discuss any questions you have with your health care provider. Document Revised: 07/27/2017 Document Reviewed: 08/07/2016 Elsevier Patient Education  2020 [employer].

## 2020-07-13 NOTE — Progress Notes (Signed)
Patient ID: Gregory Colon, male    DOB: Oct 19, 1945, 74 y.o.   MRN: [id number]  This visit was conducted in person.  BP (!) 142/82 (BP Location: Right Arm, Patient Position: Sitting, Cuff Size: Normal)   Pulse 95   Temp (!) 97.5 F (36.4 C) (Temporal)   Ht 5' 7.75" (1.721 m)   Wt 183 lb 7 oz (83.2 kg)   SpO2 96%   BMI 28.10 kg/m   BP Readings from Last 3 Encounters:  07/13/20 (!) 142/82  03/26/20 (!) 142/88  06/16/19 123/84    CC: HTN Subjective:   HPI: Gregory Colon is a 74 y.o. male presenting on 07/13/2020 for Hypertension (C/o recent higher than normal BP readings. )   HTN - Compliant with current antihypertensive regimen of amlodipine 5mg  daily.  Does check blood pressures at home: 157/74. Notes mild dizziness when he gets up in the mornings. Denies HA, vision changes, CP/tightness, SOB, leg swelling. Denies increased salt in diet. Good water intake, good fruits/vegetrables. No new supplements, no decongestants.   Ex smoker No fmhx MI, CVA Personal h/o CAD by imaging study.      Relevant past medical, surgical, family and social history reviewed and updated as indicated. Interim medical history since our last visit reviewed. Allergies and medications reviewed and updated. Outpatient Medications Prior to Visit  Medication Sig Dispense Refill  . amLODipine (NORVASC) 5 MG tablet Take 1 tablet (5 mg total) by mouth daily. 90 tablet 3  . atorvastatin (LIPITOR) 40 MG tablet Take 1 tablet (40 mg total) by mouth daily. 90 tablet 3  . Multiple Vitamins-Minerals (MULTIVITAMIN ADULTS 50+ PO) Take 1 tablet by mouth daily.     No facility-administered medications prior to visit.     Per HPI unless specifically indicated in ROS section below Review of Systems Objective:  BP (!) 142/82 (BP Location: Right Arm, Patient Position: Sitting, Cuff Size: Normal)   Pulse 95   Temp (!) 97.5 F (36.4 C) (Temporal)   Ht 5' 7.75" (1.721 m)   Wt 183 lb 7 oz (83.2 kg)   SpO2  96%   BMI 28.10 kg/m   Wt Readings from Last 3 Encounters:  07/13/20 183 lb 7 oz (83.2 kg)  03/26/20 177 lb 1 oz (80.3 kg)  06/16/19 178 lb (80.7 kg)      Physical Exam Vitals and nursing note reviewed.  Constitutional:      Appearance: Normal appearance. He is not ill-appearing.  Neck:     Thyroid: No thyroid mass or thyromegaly.  Cardiovascular:     Rate and Rhythm: Normal rate and regular rhythm.     Pulses: Normal pulses.     Heart sounds: Normal heart sounds. No murmur heard.   Pulmonary:     Effort: Pulmonary effort is normal. No respiratory distress.     Breath sounds: Normal breath sounds. No wheezing, rhonchi or rales.  Musculoskeletal:     Right lower leg: No edema.     Left lower leg: No edema.  Skin:    General: Skin is warm and dry.     Findings: No rash.  Neurological:     Mental Status: He is alert.  Psychiatric:        Mood and Affect: Mood normal.        Behavior: Behavior normal.       Lab Results  Component Value Date   CREATININE 1.09 03/26/2020   BUN 9 03/26/2020   NA 139 03/26/2020  K 4.4 03/26/2020   CL 104 03/26/2020   CO2 28 03/26/2020    Assessment & Plan:  This visit occurred during the SARS-CoV-2 public health emergency.  Safety protocols were in place, including screening questions prior to the visit, additional usage of staff PPE, and extensive cleaning of exam room while observing appropriate contact time as indicated for disinfecting solutions.   Problem List Items Addressed This Visit    Benign essential HTN    Chronic, BP trending up at home but overall adequate.  Reviewed low sodium high potassium diet.  Pt interested in additional treatment to control blood pressures. Will start lisinopril 2.5mg  daily. Reviewed side effects/allegy to watch for on ACEI. Discussed watching for hypotension. Update with effect.       Relevant Medications   lisinopril (ZESTRIL) 2.5 MG tablet       Meds ordered this encounter  Medications    . lisinopril (ZESTRIL) 2.5 MG tablet    Sig: Take 1 tablet (2.5 mg total) by mouth daily.    Dispense:  30 tablet    Refill:  6   No orders of the defined types were placed in this encounter.   Patient instructions: Blood pressure is staying a bit elevated.  Let's add lisinopril 2.5mg  daily in the mornings in addition to your amlodipine.  Continue watching blood pressures, let us know if not getting to goal on new medicine. Watch for dry cough or tongue/lip swelling as possible side effect/allergy to this medicine, let us know if that happens.   Follow up plan: Return if symptoms worsen or fail to improve.  [name], MD

## 2021-01-04 ENCOUNTER — Other Ambulatory Visit: Payer: Self-pay | Admitting: Family Medicine

## 2021-04-01 ENCOUNTER — Other Ambulatory Visit: Payer: Self-pay | Admitting: Family Medicine

## 2021-04-01 NOTE — Telephone Encounter (Signed)
Called pt to schedule appt. Pt did not answer so I left a voicemail. 

## 2021-04-01 NOTE — Telephone Encounter (Signed)
E-scribed refill.  Plz schedule wellness, lab and cpe visits.  

## 2021-05-03 ENCOUNTER — Encounter: Payer: Self-pay | Admitting: Family Medicine

## 2021-05-12 ENCOUNTER — Other Ambulatory Visit: Payer: Self-pay | Admitting: Family Medicine

## 2021-05-18 DIAGNOSIS — M1812 Unilateral primary osteoarthritis of first carpometacarpal joint, left hand: Secondary | ICD-10-CM | POA: Diagnosis not present

## 2021-05-25 ENCOUNTER — Other Ambulatory Visit: Payer: Self-pay | Admitting: Family Medicine

## 2021-05-25 DIAGNOSIS — M1811 Unilateral primary osteoarthritis of first carpometacarpal joint, right hand: Secondary | ICD-10-CM | POA: Diagnosis not present

## 2021-06-22 DIAGNOSIS — M1811 Unilateral primary osteoarthritis of first carpometacarpal joint, right hand: Secondary | ICD-10-CM | POA: Diagnosis not present

## 2021-07-31 ENCOUNTER — Other Ambulatory Visit: Payer: Self-pay | Admitting: Family Medicine

## 2021-07-31 DIAGNOSIS — E785 Hyperlipidemia, unspecified: Secondary | ICD-10-CM

## 2021-07-31 DIAGNOSIS — R7303 Prediabetes: Secondary | ICD-10-CM

## 2021-07-31 DIAGNOSIS — Z125 Encounter for screening for malignant neoplasm of prostate: Secondary | ICD-10-CM

## 2021-08-02 ENCOUNTER — Other Ambulatory Visit: Payer: Medicare Other

## 2021-08-09 ENCOUNTER — Other Ambulatory Visit: Payer: Self-pay

## 2021-08-09 ENCOUNTER — Ambulatory Visit (INDEPENDENT_AMBULATORY_CARE_PROVIDER_SITE_OTHER): Payer: Medicare Other | Admitting: Family Medicine

## 2021-08-09 ENCOUNTER — Encounter: Payer: Self-pay | Admitting: Family Medicine

## 2021-08-09 VITALS — BP 140/86 | HR 100 | Temp 97.8°F | Ht 67.5 in | Wt 180.5 lb

## 2021-08-09 DIAGNOSIS — R7303 Prediabetes: Secondary | ICD-10-CM

## 2021-08-09 DIAGNOSIS — I2510 Atherosclerotic heart disease of native coronary artery without angina pectoris: Secondary | ICD-10-CM

## 2021-08-09 DIAGNOSIS — Z0000 Encounter for general adult medical examination without abnormal findings: Secondary | ICD-10-CM | POA: Diagnosis not present

## 2021-08-09 DIAGNOSIS — E785 Hyperlipidemia, unspecified: Secondary | ICD-10-CM | POA: Diagnosis not present

## 2021-08-09 DIAGNOSIS — I10 Essential (primary) hypertension: Secondary | ICD-10-CM

## 2021-08-09 DIAGNOSIS — Z125 Encounter for screening for malignant neoplasm of prostate: Secondary | ICD-10-CM | POA: Diagnosis not present

## 2021-08-09 DIAGNOSIS — J432 Centrilobular emphysema: Secondary | ICD-10-CM

## 2021-08-09 DIAGNOSIS — Z87891 Personal history of nicotine dependence: Secondary | ICD-10-CM

## 2021-08-09 DIAGNOSIS — R011 Cardiac murmur, unspecified: Secondary | ICD-10-CM

## 2021-08-09 DIAGNOSIS — I7143 Infrarenal abdominal aortic aneurysm, without rupture: Secondary | ICD-10-CM

## 2021-08-09 LAB — COMPREHENSIVE METABOLIC PANEL
ALT: 22 U/L (ref 0–53)
AST: 17 U/L (ref 0–37)
Albumin: 4.1 g/dL (ref 3.5–5.2)
Alkaline Phosphatase: 81 U/L (ref 39–117)
BUN: 22 mg/dL (ref 6–23)
CO2: 26 mEq/L (ref 19–32)
Calcium: 9.6 mg/dL (ref 8.4–10.5)
Chloride: 104 mEq/L (ref 96–112)
Creatinine, Ser: 1.21 mg/dL (ref 0.40–1.50)
GFR: 58.27 mL/min — ABNORMAL LOW (ref >60.00)
Glucose, Bld: 111 mg/dL — ABNORMAL HIGH (ref 70–99)
Potassium: 4.2 mEq/L (ref 3.5–5.1)
Sodium: 141 mEq/L (ref 135–145)
Total Bilirubin: 0.5 mg/dL (ref 0.2–1.2)
Total Protein: 6.9 g/dL (ref 6.0–8.3)

## 2021-08-09 LAB — PSA: PSA: 0.8 ng/mL (ref 0.10–4.00)

## 2021-08-09 LAB — HEMOGLOBIN A1C: Hgb A1c MFr Bld: 6 % (ref 4.6–6.5)

## 2021-08-09 LAB — LIPID PANEL
Cholesterol: 119 mg/dL (ref 0–200)
HDL: 34.5 mg/dL — ABNORMAL LOW (ref >39.00)
LDL Cholesterol: 61 mg/dL (ref 0–99)
NonHDL: 84.2
Total CHOL/HDL Ratio: 3
Triglycerides: 118 mg/dL (ref 0.0–149.0)
VLDL: 23.6 mg/dL (ref 0.0–40.0)

## 2021-08-09 MED ORDER — AMLODIPINE BESYLATE 5 MG PO TABS: 5.0000 mg | ORAL_TABLET | Freq: Every day | ORAL | 3 refills | Status: DC

## 2021-08-09 MED ORDER — LISINOPRIL 2.5 MG PO TABS: 2.5000 mg | ORAL_TABLET | Freq: Every day | ORAL | 3 refills | Status: DC

## 2021-08-09 MED ORDER — ATORVASTATIN CALCIUM 40 MG PO TABS: 40.0000 mg | ORAL_TABLET | Freq: Every day | ORAL | 3 refills | Status: DC

## 2021-08-09 NOTE — Progress Notes (Signed)
Patient ID: Gregory Colon, male    DOB: 1946/05/09, 75 y.o.   MRN: [id number]  This visit was conducted in person.  BP 140/86   Pulse 100   Temp 97.8 F (36.6 C) (Temporal)   Ht 5' 7.5" (1.715 m)   Wt 180 lb 8 oz (81.9 kg)   SpO2 96%   BMI 27.85 kg/m   BP same on repeat testing  CC: CPE/AMW Subjective:   HPI: Gregory Colon is a 75 y.o. male presenting on 08/09/2021 for Medicare Wellness   Did not see health advisor this year.   Hearing Screening   500Hz  1000Hz  2000Hz  4000Hz   Right ear 40 40 20 0  Left ear 40 0 25 0  Vision Screening - Comments:: Last eye exam, 07/2020.  [place] Office Visit from 08/09/2021 in [place] at [facility]  PHQ-2 Total Score 0       Fall Risk  08/09/2021 03/25/2020 03/21/2019 03/15/2018 03/08/2017  Falls in the past year? 0 0 0 No No  Number falls in past yr: - 0 0 - -  Injury with Fall? - 0 0 - -  Risk for fall due to : - Medication side effect - - -  Follow up - Falls evaluation completed;Falls prevention discussed - - -    Upset he had to drive all the way to [facility] for physical this year, upset it was delayed until December this year (last physical was done July 2021) - daughter drove him here today.   H/o mild dilatation of infrarenal AA 3.7cm (03/2020), rpt 3 yrs.    L [facility] OA (seronegative arthritis vs crystalline arthropathy) followed by Dr Posey Pronto. He has had steroid injections into left wrist. Has been referred to orthopedics to consider joint replacement Sabra Heck) - new medicine significantly helping, but he's unsure which it is.   GI illness over the weekend with fever/chills, diarrhea, now feeling better. No URI symptoms or abd pain or blood in stool.   COPD - found on CT lungs 2016 - denies respiratory symptoms.   Preventative: COLONOSCOPY WITH PROPOFOL 06/16/2019 - TA x4, no f/u recommended Vicente Males, Bailey Mech, MD)  Prostate cancer screening - no fmhx. Declines DRE.  Lung cancer screening -  completed 10/2014. Declines screening.  Flu [facility]  Moderna vaccine - 11/2019, 12/2019, booster x1  Prevnar-13 07/2014, pneumovax 02/2016  Shingrix - discussed, planning to get 2023 Advanced directive - would want daughter Cristy to be HCPOA. Daughter knows what he wants. Doesn't want prolonged life support. Not interested in setting up. Declines packet today.  Seat belt use discussed  Sunscreen use discussed. No changing moles on skin.  Ex smoker - quit 2019. 50+ yrs smoking.  Alcohol - none  Dentist - does not see - has dentures Eye exam - yearly at [facility] - no diarrhea/constipation  Bladder - no incontinence   Lives alone, divorced, father of Gregory Colon  Occupation: retired, worked as Curator and in [employer]  Activity: yardwork - cutting wood and [employer], walking in evenings - in summer months Diet: some water, fruits/vegetables some     Relevant past medical, surgical, family and social history reviewed and updated as indicated. Interim medical history since our last visit reviewed. Allergies and medications reviewed and updated. Outpatient Medications Prior to Visit  Medication Sig Dispense Refill   Multiple Vitamins-Minerals (MULTIVITAMIN ADULTS 50+ PO) Take 1 tablet by mouth daily.     amLODipine (NORVASC) 5 MG  tablet TAKE 1 TABLET BY MOUTH EVERY DAY 90 tablet 0   atorvastatin (LIPITOR) 40 MG tablet TAKE 1 TABLET BY MOUTH EVERY DAY 90 tablet 0   lisinopril (ZESTRIL) 2.5 MG tablet TAKE 1 TABLET BY MOUTH EVERY DAY 90 tablet 0   meloxicam (MOBIC) 15 MG tablet Take 15 mg by mouth 3 (three) times daily.     No facility-administered medications prior to visit.     Per HPI unless specifically indicated in ROS section below Review of Systems  Constitutional:  Positive for chills and fever. Negative for activity change, appetite change, fatigue and unexpected weight change.  HENT:  Negative for hearing loss.   Eyes:  Negative for visual disturbance.   Respiratory:  Negative for cough, chest tightness, shortness of breath and wheezing.   Cardiovascular:  Negative for chest pain, palpitations and leg swelling.  Gastrointestinal:  Positive for diarrhea. Negative for abdominal distention, abdominal pain, blood in stool, constipation, nausea and vomiting.  Genitourinary:  Negative for difficulty urinating and hematuria.  Musculoskeletal:  Negative for arthralgias, myalgias and neck pain.  Skin:  Negative for rash.  Neurological:  Negative for dizziness, seizures, syncope and headaches.  Hematological:  Negative for adenopathy. Does not bruise/bleed easily.  Psychiatric/Behavioral:  Negative for dysphoric mood. The patient is not nervous/anxious.    Objective:  BP 140/86   Pulse 100   Temp 97.8 F (36.6 C) (Temporal)   Ht 5' 7.5" (1.715 m)   Wt 180 lb 8 oz (81.9 kg)   SpO2 96%   BMI 27.85 kg/m   Wt Readings from Last 3 Encounters:  08/09/21 180 lb 8 oz (81.9 kg)  07/13/20 183 lb 7 oz (83.2 kg)  03/26/20 177 lb 1 oz (80.3 kg)      Physical Exam Vitals and nursing note reviewed.  Constitutional:      General: He is not in acute distress.    Appearance: Normal appearance. He is well-developed. He is not ill-appearing.  HENT:     Head: Normocephalic and atraumatic.     Right Ear: Hearing, tympanic membrane, ear canal and external ear normal.     Left Ear: Hearing, tympanic membrane, ear canal and external ear normal.  Eyes:     General: No scleral icterus.    Extraocular Movements: Extraocular movements intact.     Conjunctiva/sclera: Conjunctivae normal.     Pupils: Pupils are equal, round, and reactive to light.  Neck:     Thyroid: No thyroid mass or thyromegaly.     Vascular: No carotid bruit.  Cardiovascular:     Rate and Rhythm: Normal rate and regular rhythm.     Pulses: Normal pulses.          Radial pulses are 2+ on the right side and 2+ on the left side.     Heart sounds: Normal heart sounds. No murmur  heard. Pulmonary:     Effort: Pulmonary effort is normal. No respiratory distress.     Breath sounds: Normal breath sounds. No wheezing, rhonchi or rales.  Abdominal:     General: Bowel sounds are normal. There is no distension.     Palpations: Abdomen is soft. There is no mass.     Tenderness: There is no abdominal tenderness. There is no guarding or rebound.     Hernia: No hernia is present.  Musculoskeletal:        General: Normal range of motion.     Cervical back: Normal range of motion and neck supple.  Right lower leg: No edema.     Left lower leg: No edema.  Lymphadenopathy:     Cervical: No cervical adenopathy.  Skin:    General: Skin is warm and dry.     Findings: No rash.  Neurological:     General: No focal deficit present.     Mental Status: He is alert and oriented to person, place, and time.     Comments:  Recall 3/3 Calculation - declines  Psychiatric:        Mood and Affect: Mood normal.        Behavior: Behavior normal.        Thought Content: Thought content normal.        Judgment: Judgment normal.      Results for orders placed or performed in visit on 03/26/20  Lipid panel  Result Value Ref Range   Cholesterol 136 0 - 200 mg/dL   Triglycerides 146.0 0.0 - 149.0 mg/dL   HDL 35.90 (L) >39.00 mg/dL   VLDL 29.2 0.0 - 40.0 mg/dL   LDL Cholesterol 71 0 - 99 mg/dL   Total CHOL/HDL Ratio 4    NonHDL 100.18   Comprehensive metabolic panel  Result Value Ref Range   Sodium 139 135 - 145 mEq/L   Potassium 4.4 3.5 - 5.1 mEq/L   Chloride 104 96 - 112 mEq/L   CO2 28 19 - 32 mEq/L   Glucose, Bld 104 (H) 70 - 99 mg/dL   BUN 9 6 - 23 mg/dL   Creatinine, Ser 1.09 0.40 - 1.50 mg/dL   Total Bilirubin 0.8 0.2 - 1.2 mg/dL   Alkaline Phosphatase 78 39 - 117 U/L   AST 17 0 - 37 U/L   ALT 22 0 - 53 U/L   Total Protein 7.1 6.0 - 8.3 g/dL   Albumin 4.4 3.5 - 5.2 g/dL   GFR 65.97 >60.00 mL/min   Calcium 9.9 8.4 - 10.5 mg/dL  Hemoglobin A1c  Result Value Ref  Range   Hgb A1c MFr Bld 6.0 4.6 - 6.5 %  Microalbumin / creatinine urine ratio  Result Value Ref Range   Microalb, Ur 1.2 0.0 - 1.9 mg/dL   Creatinine,U 192.2 mg/dL   Microalb Creat Ratio 0.6 0.0 - 30.0 mg/g  PSA, Medicare  Result Value Ref Range   PSA 0.91 0.10 - 4.00 ng/ml  POCT Urinalysis Dipstick (Automated)  Result Value Ref Range   Color, UA dark yellow    Clarity, UA cloudy    Glucose, UA Negative Negative   Bilirubin, UA negative    Ketones, UA negative    Spec Grav, UA >=1.030 (A) 1.010 - 1.025   Blood, UA negative    pH, UA 6.0 5.0 - 8.0   Protein, UA Negative Negative   Urobilinogen, UA 0.2 0.2 or 1.0 E.U./dL   Nitrite, UA negative    Leukocytes, UA Negative Negative    Assessment & Plan:  This visit occurred during the SARS-CoV-2 public health emergency.  Safety protocols were in place, including screening questions prior to the visit, additional usage of staff PPE, and extensive cleaning of exam room while observing appropriate contact time as indicated for disinfecting solutions.   Problem List Items Addressed This Visit     Medicare annual wellness visit, subsequent - Primary (Chronic)    I have personally reviewed the Medicare Annual Wellness questionnaire and have noted 1. The patient's medical and social history 2. Their use of alcohol, tobacco or illicit drugs 3. Their  current medications and supplements 4. The patient's functional ability including ADL's, fall risks, home safety risks and hearing or visual impairment. Cognitive function has been assessed and addressed as indicated.  5. Diet and physical activity 6. Evidence for depression or mood disorders The patients weight, height, BMI have been recorded in the chart. I have made referrals, counseling and provided education to the patient based on review of the above and I have provided the pt with a written personalized care plan for preventive services. Provider list updated.. See scanned  questionairre as needed for further documentation. Reviewed preventative protocols and updated unless pt declined.       Health maintenance examination (Chronic)    Preventative protocols reviewed and updated unless pt declined. Discussed healthy diet and lifestyle.       Primary hypertension    Chronic, BP borderline elevated today, advised monitor at home and let me know if consistently >140/90.       Relevant Medications   amLODipine (NORVASC) 5 MG tablet   atorvastatin (LIPITOR) 40 MG tablet   lisinopril (ZESTRIL) 2.5 MG tablet   Ex-smoker    Remains abstinent.  Declines lung cancer screening program.       Dyslipidemia    Chronic on atorvastatin - update FLP.  The 10-year ASCVD risk score (Arnett DK, et al., 2019) is: 35.7%   Values used to calculate the score:     Age: 56 years     Sex: Male     Is Non-Hispanic African American: No     Diabetic: No     Tobacco smoker: Yes     Systolic Blood Pressure: 676 mmHg     Is BP treated: Yes     HDL Cholesterol: 35.9 mg/dL     Total Cholesterol: 136 mg/dL       Relevant Medications   atorvastatin (LIPITOR) 40 MG tablet   CAD (coronary artery disease)    Continue statin.       Relevant Medications   amLODipine (NORVASC) 5 MG tablet   atorvastatin (LIPITOR) 40 MG tablet   lisinopril (ZESTRIL) 2.5 MG tablet   Emphysema of lung (HCC)    Asymptomatic. Quit smoking 2019.       AAA (abdominal aortic aneurysm)    Rpt imaging will be due 2024.  Reviewed importance of good blood pressure control to prevent progression.       Relevant Medications   amLODipine (NORVASC) 5 MG tablet   atorvastatin (LIPITOR) 40 MG tablet   lisinopril (ZESTRIL) 2.5 MG tablet   Prediabetes    Update A1c.       Systolic murmur    Not appreciated today. Continue to monitor.       Other Visit Diagnoses     Special screening for malignant neoplasm of prostate            Meds ordered this encounter  Medications   amLODipine  (NORVASC) 5 MG tablet    Sig: Take 1 tablet (5 mg total) by mouth daily.    Dispense:  90 tablet    Refill:  3   atorvastatin (LIPITOR) 40 MG tablet    Sig: Take 1 tablet (40 mg total) by mouth daily.    Dispense:  90 tablet    Refill:  3   lisinopril (ZESTRIL) 2.5 MG tablet    Sig: Take 1 tablet (2.5 mg total) by mouth daily.    Dispense:  90 tablet    Refill:  3    No  orders of the defined types were placed in this encounter.   Patient instructions: Labs today  Send me dates of COVID booster. Check with pharmacy about shingrix vaccine.  Return as needed or in 1 year for next physical/wellness visit.   Follow up plan: Return in about 1 year (around 08/09/2022) for annual exam, prior fasting for blood work, medicare wellness visit.  [name], MD

## 2021-08-09 NOTE — Assessment & Plan Note (Signed)
Chronic on atorvastatin - update FLP.  The 10-year ASCVD risk score (Arnett DK, et al., 2019) is: 35.7%   Values used to calculate the score:     Age: 75 years     Sex: Male     Is Non-Hispanic African American: No     Diabetic: No     Tobacco smoker: Yes     Systolic Blood Pressure: 865 mmHg     Is BP treated: Yes     HDL Cholesterol: 35.9 mg/dL     Total Cholesterol: 136 mg/dL

## 2021-08-09 NOTE — Assessment & Plan Note (Signed)

## 2021-08-09 NOTE — Assessment & Plan Note (Signed)
Preventative protocols reviewed and updated unless pt declined. Discussed healthy diet and lifestyle.  

## 2021-08-09 NOTE — Assessment & Plan Note (Addendum)
Chronic, BP borderline elevated today, advised monitor at home and let me know if consistently >140/90.

## 2021-08-09 NOTE — Assessment & Plan Note (Signed)
Asymptomatic. Quit smoking 2019.

## 2021-08-09 NOTE — Assessment & Plan Note (Addendum)
Remains abstinent.  Declines lung cancer screening program.

## 2021-08-09 NOTE — Assessment & Plan Note (Signed)
Continue statin. 

## 2021-08-09 NOTE — Patient Instructions (Signed)
Labs today  Send me dates of COVID booster. Check with pharmacy about shingrix vaccine.  Return as needed or in 1 year for next physical/wellness visit.   Health Maintenance After Age 76 After age 22, you are at a higher risk for certain long-term diseases and infections as well as injuries from falls. Falls are a major cause of broken bones and head injuries in people who are older than age 59. Getting regular preventive care can help to keep you healthy and well. Preventive care includes getting regular testing and making lifestyle changes as recommended by your health care provider. Talk with your health care provider about: Which screenings and tests you should have. A screening is a test that checks for a disease when you have no symptoms. A diet and exercise plan that is right for you. What should I know about screenings and tests to prevent falls? Screening and testing are the best ways to find a health problem early. Early diagnosis and treatment give you the best chance of managing medical conditions that are common after age 52. Certain conditions and lifestyle choices may make you more likely to have a fall. Your health care provider may recommend: Regular vision checks. Poor vision and conditions such as cataracts can make you more likely to have a fall. If you wear glasses, make sure to get your prescription updated if your vision changes. Medicine review. Work with your health care provider to regularly review all of the medicines you are taking, including over-the-counter medicines. Ask your health care provider about any side effects that may make you more likely to have a fall. Tell your health care provider if any medicines that you take make you feel dizzy or sleepy. Strength and balance checks. Your health care provider may recommend certain tests to check your strength and balance while standing, walking, or changing positions. Foot health exam. Foot pain and numbness, as well as  not wearing proper footwear, can make you more likely to have a fall. Screenings, including: Osteoporosis screening. Osteoporosis is a condition that causes the bones to get weaker and break more easily. Blood pressure screening. Blood pressure changes and medicines to control blood pressure can make you feel dizzy. Depression screening. You may be more likely to have a fall if you have a fear of falling, feel depressed, or feel unable to do activities that you used to do. Alcohol use screening. Using too much alcohol can affect your balance and may make you more likely to have a fall. Follow these instructions at home: Lifestyle Do not drink alcohol if: Your health care provider tells you not to drink. If you drink alcohol: Limit how much you have to: 0-1 drink a day for women. 0-2 drinks a day for men. Know how much alcohol is in your drink. In the [place] one drink equals one 12 oz bottle of beer (355 mL), one 5 oz glass of wine (148 mL), or one 1 oz glass of hard liquor (44 mL). Do not use any products that contain nicotine or tobacco. These products include cigarettes, chewing tobacco, and vaping devices, such as e-cigarettes. If you need help quitting, ask your health care provider. Activity  Follow a regular exercise program to stay fit. This will help you maintain your balance. Ask your health care provider what types of exercise are appropriate for you. If you need a cane or walker, use it as recommended by your health care provider. Wear supportive shoes that have nonskid soles. Safety  Remove any tripping hazards, such as rugs, cords, and clutter. Install safety equipment such as grab bars in bathrooms and safety rails on stairs. Keep rooms and walkways well-lit. General instructions Talk with your health care provider about your risks for falling. Tell your health care provider if: You fall. Be sure to tell your health care provider about all falls, even ones that seem  minor. You feel dizzy, tiredness (fatigue), or off-balance. Take over-the-counter and prescription medicines only as told by your health care provider. These include supplements. Eat a healthy diet and maintain a healthy weight. A healthy diet includes low-fat dairy products, low-fat (lean) meats, and fiber from whole grains, beans, and lots of fruits and vegetables. Stay current with your vaccines. Schedule regular health, dental, and eye exams. Summary Having a healthy lifestyle and getting preventive care can help to protect your health and wellness after age 33. Screening and testing are the best way to find a health problem early and help you avoid having a fall. Early diagnosis and treatment give you the best chance for managing medical conditions that are more common for people who are older than age 40. Falls are a major cause of broken bones and head injuries in people who are older than age 67. Take precautions to prevent a fall at home. Work with your health care provider to learn what changes you can make to improve your health and wellness and to prevent falls. This information is not intended to replace advice given to you by your health care provider. Make sure you discuss any questions you have with your health care provider. Document Revised: 01/03/2021 Document Reviewed: 01/03/2021 Elsevier Patient Education  [place].

## 2021-08-09 NOTE — Assessment & Plan Note (Addendum)
Rpt imaging will be due 2024.  Reviewed importance of good blood pressure control to prevent progression.

## 2021-08-09 NOTE — Assessment & Plan Note (Signed)
Update A1c ?

## 2021-08-09 NOTE — Assessment & Plan Note (Signed)
Not appreciated today. Continue to monitor.

## 2021-08-30 ENCOUNTER — Encounter: Payer: Medicare Other | Admitting: Family Medicine

## 2021-11-17 ENCOUNTER — Encounter: Payer: Self-pay | Admitting: Family Medicine

## 2021-12-01 NOTE — Telephone Encounter (Signed)
Scheduled CPE on 02/27/22 at 9:00 and cpe labs on 02/20/22 at 8:30.  Notified pt via [place].  ?

## 2022-02-19 ENCOUNTER — Other Ambulatory Visit: Payer: Self-pay | Admitting: Family Medicine

## 2022-02-19 DIAGNOSIS — I10 Essential (primary) hypertension: Secondary | ICD-10-CM

## 2022-02-19 DIAGNOSIS — E785 Hyperlipidemia, unspecified: Secondary | ICD-10-CM

## 2022-02-19 DIAGNOSIS — R7303 Prediabetes: Secondary | ICD-10-CM

## 2022-02-19 DIAGNOSIS — Z125 Encounter for screening for malignant neoplasm of prostate: Secondary | ICD-10-CM

## 2022-02-20 ENCOUNTER — Other Ambulatory Visit (INDEPENDENT_AMBULATORY_CARE_PROVIDER_SITE_OTHER): Payer: Medicare Other

## 2022-02-20 ENCOUNTER — Other Ambulatory Visit: Payer: Medicare Other

## 2022-02-20 DIAGNOSIS — I10 Essential (primary) hypertension: Secondary | ICD-10-CM

## 2022-02-20 DIAGNOSIS — E785 Hyperlipidemia, unspecified: Secondary | ICD-10-CM | POA: Diagnosis not present

## 2022-02-20 DIAGNOSIS — R7303 Prediabetes: Secondary | ICD-10-CM

## 2022-02-20 DIAGNOSIS — Z125 Encounter for screening for malignant neoplasm of prostate: Secondary | ICD-10-CM

## 2022-02-20 LAB — CBC WITH DIFFERENTIAL/PLATELET
Basophils Absolute: 0.1 10*3/uL (ref 0.0–0.1)
Basophils Relative: 1.6 % (ref 0.0–3.0)
Eosinophils Absolute: 0.5 10*3/uL (ref 0.0–0.7)
Eosinophils Relative: 6.5 % — ABNORMAL HIGH (ref 0.0–5.0)
HCT: 42.3 % (ref 39.0–52.0)
Hemoglobin: 14.2 g/dL (ref 13.0–17.0)
Lymphocytes Relative: 23.1 % (ref 12.0–46.0)
Lymphs Abs: 1.9 10*3/uL (ref 0.7–4.0)
MCHC: 33.6 g/dL (ref 30.0–36.0)
MCV: 91.7 fl (ref 78.0–100.0)
Monocytes Absolute: 1 10*3/uL (ref 0.1–1.0)
Monocytes Relative: 11.8 % (ref 3.0–12.0)
Neutro Abs: 4.7 10*3/uL (ref 1.4–7.7)
Neutrophils Relative %: 57 % (ref 43.0–77.0)
Platelets: 289 10*3/uL (ref 150.0–400.0)
RBC: 4.61 Mil/uL (ref 4.22–5.81)
RDW: 14.8 % (ref 11.5–15.5)
WBC: 8.2 10*3/uL (ref 4.0–10.5)

## 2022-02-20 LAB — COMPREHENSIVE METABOLIC PANEL
ALT: 26 U/L (ref 0–53)
AST: 17 U/L (ref 0–37)
Albumin: 4.2 g/dL (ref 3.5–5.2)
Alkaline Phosphatase: 71 U/L (ref 39–117)
BUN: 16 mg/dL (ref 6–23)
CO2: 29 mEq/L (ref 19–32)
Calcium: 9.7 mg/dL (ref 8.4–10.5)
Chloride: 103 mEq/L (ref 96–112)
Creatinine, Ser: 1.11 mg/dL (ref 0.40–1.50)
GFR: 64.39 mL/min (ref >60.00)
Glucose, Bld: 102 mg/dL — ABNORMAL HIGH (ref 70–99)
Potassium: 4.9 mEq/L (ref 3.5–5.1)
Sodium: 141 mEq/L (ref 135–145)
Total Bilirubin: 0.6 mg/dL (ref 0.2–1.2)
Total Protein: 6.7 g/dL (ref 6.0–8.3)

## 2022-02-20 LAB — LIPID PANEL
Cholesterol: 142 mg/dL (ref 0–200)
HDL: 39.3 mg/dL (ref >39.00)
LDL Cholesterol: 74 mg/dL (ref 0–99)
NonHDL: 102.78
Total CHOL/HDL Ratio: 4
Triglycerides: 145 mg/dL (ref 0.0–149.0)
VLDL: 29 mg/dL (ref 0.0–40.0)

## 2022-02-20 LAB — HEMOGLOBIN A1C: Hgb A1c MFr Bld: 6.2 % (ref 4.6–6.5)

## 2022-02-20 LAB — PSA: PSA: 0.92 ng/mL (ref 0.10–4.00)

## 2022-02-27 ENCOUNTER — Ambulatory Visit (INDEPENDENT_AMBULATORY_CARE_PROVIDER_SITE_OTHER): Payer: Medicare Other | Admitting: Family Medicine

## 2022-02-27 ENCOUNTER — Encounter: Payer: Self-pay | Admitting: Family Medicine

## 2022-02-27 ENCOUNTER — Encounter: Payer: Medicare Other | Admitting: Family Medicine

## 2022-02-27 VITALS — BP 132/82 | HR 100 | Temp 98.2°F | Ht 67.25 in | Wt 192.4 lb

## 2022-02-27 DIAGNOSIS — I2510 Atherosclerotic heart disease of native coronary artery without angina pectoris: Secondary | ICD-10-CM | POA: Diagnosis not present

## 2022-02-27 DIAGNOSIS — E785 Hyperlipidemia, unspecified: Secondary | ICD-10-CM

## 2022-02-27 DIAGNOSIS — R011 Cardiac murmur, unspecified: Secondary | ICD-10-CM

## 2022-02-27 DIAGNOSIS — I7143 Infrarenal abdominal aortic aneurysm, without rupture: Secondary | ICD-10-CM | POA: Diagnosis not present

## 2022-02-27 DIAGNOSIS — I10 Essential (primary) hypertension: Secondary | ICD-10-CM

## 2022-02-27 DIAGNOSIS — M1812 Unilateral primary osteoarthritis of first carpometacarpal joint, left hand: Secondary | ICD-10-CM

## 2022-02-27 DIAGNOSIS — J432 Centrilobular emphysema: Secondary | ICD-10-CM | POA: Diagnosis not present

## 2022-02-27 DIAGNOSIS — Z0000 Encounter for general adult medical examination without abnormal findings: Secondary | ICD-10-CM

## 2022-02-27 DIAGNOSIS — R7303 Prediabetes: Secondary | ICD-10-CM | POA: Diagnosis not present

## 2022-02-27 DIAGNOSIS — N281 Cyst of kidney, acquired: Secondary | ICD-10-CM | POA: Diagnosis not present

## 2022-02-27 MED ORDER — AMLODIPINE BESYLATE 5 MG PO TABS: 5.0000 mg | ORAL_TABLET | Freq: Every day | ORAL | 3 refills | Status: DC

## 2022-02-27 MED ORDER — ATORVASTATIN CALCIUM 40 MG PO TABS
40.0000 mg | ORAL_TABLET | Freq: Every day | ORAL | 3 refills | Status: DC
Start: 2022-02-27 — End: 2023-03-09

## 2022-02-27 MED ORDER — LISINOPRIL 2.5 MG PO TABS
2.5000 mg | ORAL_TABLET | Freq: Every day | ORAL | 3 refills | Status: DC
Start: 2022-02-27 — End: 2023-03-09

## 2022-02-27 NOTE — Patient Instructions (Addendum)
EKG today  Continue current medicines.  Good to see you today. Return as needed or in 1 year for next physical.   Health Maintenance After Age 77 After age 55, you are at a higher risk for certain long-term diseases and infections as well as injuries from falls. Falls are a major cause of broken bones and head injuries in people who are older than age 20. Getting regular preventive care can help to keep you healthy and well. Preventive care includes getting regular testing and making lifestyle changes as recommended by your health care provider. Talk with your health care provider about: Which screenings and tests you should have. A screening is a test that checks for a disease when you have no symptoms. A diet and exercise plan that is right for you. What should I know about screenings and tests to prevent falls? Screening and testing are the best ways to find a health problem early. Early diagnosis and treatment give you the best chance of managing medical conditions that are common after age 48. Certain conditions and lifestyle choices may make you more likely to have a fall. Your health care provider may recommend: Regular vision checks. Poor vision and conditions such as cataracts can make you more likely to have a fall. If you wear glasses, make sure to get your prescription updated if your vision changes. Medicine review. Work with your health care provider to regularly review all of the medicines you are taking, including over-the-counter medicines. Ask your health care provider about any side effects that may make you more likely to have a fall. Tell your health care provider if any medicines that you take make you feel dizzy or sleepy. Strength and balance checks. Your health care provider may recommend certain tests to check your strength and balance while standing, walking, or changing positions. Foot health exam. Foot pain and numbness, as well as not wearing proper footwear, can make you  more likely to have a fall. Screenings, including: Osteoporosis screening. Osteoporosis is a condition that causes the bones to get weaker and break more easily. Blood pressure screening. Blood pressure changes and medicines to control blood pressure can make you feel dizzy. Depression screening. You may be more likely to have a fall if you have a fear of falling, feel depressed, or feel unable to do activities that you used to do. Alcohol use screening. Using too much alcohol can affect your balance and may make you more likely to have a fall. Follow these instructions at home: Lifestyle Do not drink alcohol if: Your health care provider tells you not to drink. If you drink alcohol: Limit how much you have to: 0-1 drink a day for women. 0-2 drinks a day for men. Know how much alcohol is in your drink. In the [place] one drink equals one 12 oz bottle of beer (355 mL), one 5 oz glass of wine (148 mL), or one 1 oz glass of hard liquor (44 mL). Do not use any products that contain nicotine or tobacco. These products include cigarettes, chewing tobacco, and vaping devices, such as e-cigarettes. If you need help quitting, ask your health care provider. Activity  Follow a regular exercise program to stay fit. This will help you maintain your balance. Ask your health care provider what types of exercise are appropriate for you. If you need a cane or walker, use it as recommended by your health care provider. Wear supportive shoes that have nonskid soles. Safety  Remove any tripping hazards,  such as rugs, cords, and clutter. Install safety equipment such as grab bars in bathrooms and safety rails on stairs. Keep rooms and walkways well-lit. General instructions Talk with your health care provider about your risks for falling. Tell your health care provider if: You fall. Be sure to tell your health care provider about all falls, even ones that seem minor. You feel dizzy, tiredness (fatigue), or  off-balance. Take over-the-counter and prescription medicines only as told by your health care provider. These include supplements. Eat a healthy diet and maintain a healthy weight. A healthy diet includes low-fat dairy products, low-fat (lean) meats, and fiber from whole grains, beans, and lots of fruits and vegetables. Stay current with your vaccines. Schedule regular health, dental, and eye exams. Summary Having a healthy lifestyle and getting preventive care can help to protect your health and wellness after age 19. Screening and testing are the best way to find a health problem early and help you avoid having a fall. Early diagnosis and treatment give you the best chance for managing medical conditions that are more common for people who are older than age 49. Falls are a major cause of broken bones and head injuries in people who are older than age 21. Take precautions to prevent a fall at home. Work with your health care provider to learn what changes you can make to improve your health and wellness and to prevent falls. This information is not intended to replace advice given to you by your health care provider. Make sure you discuss any questions you have with your health care provider. Document Revised: 01/03/2021 Document Reviewed: 01/03/2021 Elsevier Patient Education  [place].

## 2022-02-27 NOTE — Assessment & Plan Note (Signed)
Continues statin. Update EKG.

## 2022-02-27 NOTE — Assessment & Plan Note (Signed)
Will be due for repeat imaging next year.

## 2022-02-27 NOTE — Assessment & Plan Note (Signed)
Chronic, stable. Continue current regimen. 

## 2022-02-27 NOTE — Assessment & Plan Note (Signed)
Limit added sugars in diet.  

## 2022-02-27 NOTE — Assessment & Plan Note (Signed)
Asymptomatic off respiratory medication.

## 2022-02-27 NOTE — Assessment & Plan Note (Signed)
S/p reassuring abd MRI 2016

## 2022-02-27 NOTE — Assessment & Plan Note (Signed)
Preventative protocols reviewed and updated unless pt declined. Discussed healthy diet and lifestyle.  

## 2022-02-27 NOTE — Assessment & Plan Note (Signed)
Chronic, well controlled on current regimen. The 10-year ASCVD risk score (Arnett DK, et al., 2019) is: 32.4%   Values used to calculate the score:     Age: 76 years     Sex: Male     Is Non-Hispanic African American: No     Diabetic: No     Tobacco smoker: Yes     Systolic Blood Pressure: 358 mmHg     Is BP treated: Yes     HDL Cholesterol: 39.3 mg/dL     Total Cholesterol: 142 mg/dL

## 2022-02-27 NOTE — Assessment & Plan Note (Signed)
Again heard today - will continue to monitor.

## 2022-02-27 NOTE — Progress Notes (Signed)
Patient ID: Gregory Colon, male    DOB: 11-27-1945, 76 y.o.   MRN: [id number]  This visit was conducted in person.  BP 132/82   Pulse 100   Temp 98.2 F (36.8 C) (Temporal)   Ht 5' 7.25" (1.708 m)   Wt 192 lb 6 oz (87.3 kg)   SpO2 95%   BMI 29.91 kg/m    CC: CPE Subjective:   HPI: Gregory Colon is a 76 y.o. male presenting on 02/27/2022 for Annual Exam   No results found.  [place] Office Visit from 02/27/2022 in [place] at [place]  PHQ-2 Total Score 0          08/09/2021    8:00 AM 03/25/2020    8:14 AM 03/21/2019    7:49 AM 03/15/2018    8:43 AM 03/08/2017    8:59 AM  Fall Risk   Falls in the past year? 0 0 0 No No  Number falls in past yr:  0 0    Injury with Fall?  0 0    Risk for fall due to :  Medication side effect     Follow up  Falls evaluation completed;Falls prevention discussed     Last year's physical /medicare wellness was delayed until 07/2022 due to scheduling difficulties, making patient upset. Desires to get physicals in the summer each year. Daughter checked with insurance, ok to do 1 physical per calendar year.   H/o mild dilatation of infrarenal AA 3.7cm (03/2020), rpt 3 yrs.   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 He has had steroid injections into left wrist. Saw ortho Dr Gregory Colon, declined joint replacement surgery, managing with daily oral NSAID.   COPD - found on CT lungs 2016 - no respiratory symptoms.  Preventative: COLONOSCOPY WITH PROPOFOL 06/16/2019 - TA x4, no f/u recommended Gregory Colon, Gregory Mech, MD)  Prostate cancer screening - no fmhx. Declines DRE.  Lung cancer screening - done 10/2014. Declined rpt screening.  Flu yearly at [facility]  Moderna vaccine - 11/2019, 12/2019, booster x1  Prevnar-13 07/2014, pneumovax 02/2016  Shingrix - 08/2021, 11/2021  Advanced directive - would want daughter Gregory Colon to be HCPOA. Daughter knows what he wants. Doesn't want prolonged life support. Not  interested in setting up. Declines packet today.  Seat belt use discussed  Sunscreen use discussed. No changing moles on skin.  Sleep - averaging 9 hours/night  Ex smoker - quit 2019. 50+ yrs smoking.  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 Interim medical history since our last visit reviewed. Allergies and medications reviewed and updated. Outpatient Medications Prior to Visit  Medication Sig Dispense Refill   Multiple Vitamins-Minerals (MULTIVITAMIN ADULTS 50+ PO) Take 1 tablet by mouth daily.     amLODipine (NORVASC) 5 MG tablet Take 1 tablet (5 mg total) by mouth daily. 90 tablet 3   atorvastatin (LIPITOR) 40 MG tablet Take 1 tablet (40 mg total) by mouth daily. 90 tablet 3  lisinopril (ZESTRIL) 2.5 MG tablet Take 1 tablet (2.5 mg total) by mouth daily. 90 tablet 3   No facility-administered medications prior to visit.     Per HPI unless specifically indicated in ROS section below Review of Systems  Constitutional:  Negative for activity change, appetite change, chills, fatigue, fever and unexpected weight change.  HENT:  Positive for rhinorrhea. Negative for hearing loss and nosebleeds.   Eyes:  Negative for visual disturbance.  Respiratory:  Positive for cough (allergy related). Negative for chest tightness, shortness of breath and wheezing.   Cardiovascular:  Positive for leg swelling (occ). Negative for chest pain and palpitations.  Gastrointestinal:  Negative for abdominal  distention, abdominal pain, blood in stool, constipation, diarrhea, nausea and vomiting.  Genitourinary:  Negative for difficulty urinating and hematuria.  Musculoskeletal:  Negative for arthralgias, myalgias and neck pain.  Skin:  Negative for rash.  Neurological:  Negative for dizziness, seizures, syncope and headaches.  Hematological:  Negative for adenopathy. Bruises/bleeds easily.  Psychiatric/Behavioral:  Negative for dysphoric mood. The patient is not nervous/anxious.     Objective:  BP 132/82   Pulse 100   Temp 98.2 F (36.8 C) (Temporal)   Ht 5' 7.25" (1.708 m)   Wt 192 lb 6 oz (87.3 kg)   SpO2 95%   BMI 29.91 kg/m   Wt Readings from Last 3 Encounters:  02/27/22 192 lb 6 oz (87.3 kg)  08/09/21 180 lb 8 oz (81.9 kg)  07/13/20 183 lb 7 oz (83.2 kg)      Physical Exam Vitals and nursing note reviewed.  Constitutional:      General: He is not in acute distress.    Appearance: Normal appearance. He is well-developed. He is not ill-appearing.  HENT:     Head: Normocephalic and atraumatic.     Right Ear: Hearing, tympanic membrane, ear canal and external ear normal.     Left Ear: Hearing, tympanic membrane, ear canal and external ear normal.  Eyes:     General: No scleral icterus.    Extraocular Movements: Extraocular movements intact.     Conjunctiva/sclera: Conjunctivae normal.     Pupils: Pupils are equal, round, and reactive to light.  Neck:     Thyroid: No thyroid mass or thyromegaly.     Vascular: No carotid bruit.  Cardiovascular:     Rate and Rhythm: Normal rate and regular rhythm. Extrasystoles are present.    Pulses: Normal pulses.          Radial pulses are 2+ on the right side and 2+ on the left side.     Heart sounds: Murmur (2/6 systolic USB) heard.  Pulmonary:     Effort: Pulmonary effort is normal. No respiratory distress.     Breath sounds: Normal breath sounds. No wheezing, rhonchi or rales.  Abdominal:     General: Bowel sounds are normal. There  is no distension.     Palpations: Abdomen is soft. There is no mass.     Tenderness: There is no abdominal tenderness. There is no guarding or rebound.     Hernia: No hernia is present.  Musculoskeletal:        General: Normal range of motion.     Cervical back: Normal range of motion and neck supple.     Right lower leg: No edema.     Left lower leg: No edema.     Comments: 2+ DP bilaterally  Lymphadenopathy:     Cervical: No cervical adenopathy.  Skin:  General: Skin is warm and dry.     Findings: No rash.  Neurological:     General: No focal deficit present.     Mental Status: He is alert and oriented to person, place, and time.  Psychiatric:        Mood and Affect: Mood normal.        Behavior: Behavior normal.        Thought Content: Thought content normal.        Judgment: Judgment normal.       Results for orders placed or performed in visit on 02/20/22  CBC with Differential/Platelet  Result Value Ref Range   WBC 8.2 4.0 - 10.5 K/uL   RBC 4.61 4.22 - 5.81 Mil/uL   Hemoglobin 14.2 13.0 - 17.0 g/dL   HCT 42.3 39.0 - 52.0 %   MCV 91.7 78.0 - 100.0 fl   MCHC 33.6 30.0 - 36.0 g/dL   RDW 14.8 11.5 - 15.5 %   Platelets 289.0 150.0 - 400.0 K/uL   Neutrophils Relative % 57.0 43.0 - 77.0 %   Lymphocytes Relative 23.1 12.0 - 46.0 %   Monocytes Relative 11.8 3.0 - 12.0 %   Eosinophils Relative 6.5 (H) 0.0 - 5.0 %   Basophils Relative 1.6 0.0 - 3.0 %   Neutro Abs 4.7 1.4 - 7.7 K/uL   Lymphs Abs 1.9 0.7 - 4.0 K/uL   Monocytes Absolute 1.0 0.1 - 1.0 K/uL   Eosinophils Absolute 0.5 0.0 - 0.7 K/uL   Basophils Absolute 0.1 0.0 - 0.1 K/uL  PSA  Result Value Ref Range   PSA 0.92 0.10 - 4.00 ng/mL  Hemoglobin A1c  Result Value Ref Range   Hgb A1c MFr Bld 6.2 4.6 - 6.5 %  Comprehensive metabolic panel  Result Value Ref Range   Sodium 141 135 - 145 mEq/L   Potassium 4.9 3.5 - 5.1 mEq/L   Chloride 103 96 - 112 mEq/L   CO2 29 19 - 32 mEq/L   Glucose, Bld 102 (H) 70 - 99  mg/dL   BUN 16 6 - 23 mg/dL   Creatinine, Ser 1.11 0.40 - 1.50 mg/dL   Total Bilirubin 0.6 0.2 - 1.2 mg/dL   Alkaline Phosphatase 71 39 - 117 U/L   AST 17 0 - 37 U/L   ALT 26 0 - 53 U/L   Total Protein 6.7 6.0 - 8.3 g/dL   Albumin 4.2 3.5 - 5.2 g/dL   GFR 64.39 >60.00 mL/min   Calcium 9.7 8.4 - 10.5 mg/dL  Lipid panel  Result Value Ref Range   Cholesterol 142 0 - 200 mg/dL   Triglycerides 145.0 0.0 - 149.0 mg/dL   HDL 39.30 >39.00 mg/dL   VLDL 29.0 0.0 - 40.0 mg/dL   LDL Cholesterol 74 0 - 99 mg/dL   Total CHOL/HDL Ratio 4    NonHDL 102.78    EKG - sinus rhythm 90s with frequent PACs, LAD, normal intervals, no hypertrophy or acute ST/T changes, ?p mitrale  Assessment & Plan:   Problem List Items Addressed This Visit     Health maintenance examination - Primary (Chronic)    Preventative protocols reviewed and updated unless pt declined. Discussed healthy diet and lifestyle.       Primary hypertension    Chronic, stable. Continue current regimen.       Relevant Medications   amLODipine (NORVASC) 5 MG tablet   lisinopril (ZESTRIL) 2.5 MG tablet   atorvastatin (LIPITOR) 40 MG tablet  Other Relevant Orders   EKG 12-Lead (Completed)   Dyslipidemia    Chronic, well controlled on current regimen. The 10-year ASCVD risk score (Arnett DK, et al., 2019) is: 32.4%   Values used to calculate the score:     Age: 46 years     Sex: Male     Is Non-Hispanic African American: No     Diabetic: No     Tobacco smoker: Yes     Systolic Blood Pressure: 503 mmHg     Is BP treated: Yes     HDL Cholesterol: 39.3 mg/dL     Total Cholesterol: 142 mg/dL       Relevant Medications   atorvastatin (LIPITOR) 40 MG tablet   CAD (coronary artery disease)    Continues statin. Update EKG.       Relevant Medications   amLODipine (NORVASC) 5 MG tablet   lisinopril (ZESTRIL) 2.5 MG tablet   atorvastatin (LIPITOR) 40 MG tablet   Emphysema of lung (HCC)    Asymptomatic off respiratory  medication.       Acquired complex cyst of kidney    S/p reassuring abd MRI 2016      AAA (abdominal aortic aneurysm) ([place])    Will be due for repeat imaging next year.       Relevant Medications   amLODipine (NORVASC) 5 MG tablet   lisinopril (ZESTRIL) 2.5 MG tablet   atorvastatin (LIPITOR) 40 MG tablet   Prediabetes    Limit added sugars in diet.       Osteoarthritis of carpometacarpal (CMC) joint of thumb    Seronegative arthritis vs crystalline arthropathy - saw rheum, now followed by ortho Dr Gregory Colon on daily oral NSAID.       Systolic murmur    Again heard today - will continue to monitor.         Meds ordered this encounter  Medications   amLODipine (NORVASC) 5 MG tablet    Sig: Take 1 tablet (5 mg total) by mouth daily.    Dispense:  90 tablet    Refill:  3   lisinopril (ZESTRIL) 2.5 MG tablet    Sig: Take 1 tablet (2.5 mg total) by mouth daily.    Dispense:  90 tablet    Refill:  3   atorvastatin (LIPITOR) 40 MG tablet    Sig: Take 1 tablet (40 mg total) by mouth daily.    Dispense:  90 tablet    Refill:  3   Orders Placed This Encounter  Procedures   EKG 12-Lead    Patient instructions: EKG today  Continue current medicines.  Good to see you today. Return as needed or in 1 year for next physical.   Follow up plan: Return in about 1 year (around 02/28/2023) for annual exam, prior fasting for blood work, medicare wellness visit.  [name], MD

## 2022-02-27 NOTE — Assessment & Plan Note (Signed)
Seronegative arthritis vs crystalline arthropathy - saw rheum, now followed by ortho Dr Sabra Heck on daily oral NSAID.

## 2022-03-31 ENCOUNTER — Other Ambulatory Visit: Payer: Medicare Other

## 2022-04-07 ENCOUNTER — Encounter: Payer: Medicare Other | Admitting: Family Medicine

## 2022-07-28 ENCOUNTER — Telehealth: Payer: Self-pay | Admitting: Family Medicine

## 2022-07-28 NOTE — Telephone Encounter (Signed)
Left message for patient to call back and schedule Medicare Annual Wellness Visit (AWV) either virtually or iphone  Left  my Gregory Colon number [phone]   Last AWV 08/09/21 please schedule with Nurse Health Adviser   45 min for awv-i and in office appointments 30 min for awv-s  phone/virtual appointments

## 2022-08-17 ENCOUNTER — Ambulatory Visit (INDEPENDENT_AMBULATORY_CARE_PROVIDER_SITE_OTHER): Payer: Medicare Other

## 2022-08-17 VITALS — Ht 67.25 in | Wt 192.0 lb

## 2022-08-17 DIAGNOSIS — Z0000 Encounter for general adult medical examination without abnormal findings: Secondary | ICD-10-CM | POA: Diagnosis not present

## 2022-08-17 NOTE — Progress Notes (Signed)
Subjective:   Gregory Colon is a 76 y.o. male who presents for Medicare Annual/Subsequent preventive examination.  Review of Systems    Virtual Visit via Telephone Note  I connected with  Gregory Colon on 08/17/22 at  8:45 AM EST by telephone and verified that I am speaking with the correct person using two identifiers.  Location: Patient: Home Provider: Office Persons participating in the virtual visit: patient/Nurse Health Advisor   I discussed the limitations, risks, security and privacy concerns of performing an evaluation and management service by telephone and the availability of in person appointments. The patient expressed understanding and agreed to proceed.  Interactive audio and video telecommunications were attempted between this nurse and patient, however failed, due to patient having technical difficulties OR patient did not have access to video capability.  We continued and completed visit with audio only.  Some vital signs may be absent or patient reported.   Criselda Peaches, LPN  Cardiac Risk Factors include: advanced age (>41mn, >>61women);male gender;hypertension     Objective:    Today's Vitals   08/17/22 0845  Weight: 192 lb (87.1 kg)  Height: 5' 7.25" (1.708 m)   Body mass index is 29.85 kg/m.     08/17/2022    8:50 AM 03/25/2020    8:14 AM 06/16/2019    8:34 AM 03/08/2017    9:00 AM 05/11/2016   12:36 PM 01/08/2015    9:44 AM  Advanced Directives  Does Patient Have a Medical Advance Directive? No No No No No No  Would patient like information on creating a medical advance directive? No - Patient declined No - Patient declined No - Patient declined No - Patient declined No - patient declined information No - patient declined information    Current Medications (verified) Outpatient Encounter Medications as of 08/17/2022  Medication Sig   amLODipine (NORVASC) 5 MG tablet Take 1 tablet (5 mg total) by mouth daily.   atorvastatin  (LIPITOR) 40 MG tablet Take 1 tablet (40 mg total) by mouth daily.   lisinopril (ZESTRIL) 2.5 MG tablet Take 1 tablet (2.5 mg total) by mouth daily.   meloxicam (MOBIC) 15 MG tablet Take 15 mg by mouth. Daily with food   Multiple Vitamins-Minerals (MULTIVITAMIN ADULTS 50+ PO) Take 1 tablet by mouth daily.   No facility-administered encounter medications on file as of 08/17/2022.    Allergies (verified) Patient has no known allergies.   History: Past Medical History:  Diagnosis Date   [place]   by MRI 3cm   Arthritis    R hand   CAD (coronary artery disease) 10/2014   by CT   [place] lung [place]   [place]   Fatty liver 11/2014   by [place]  History of chicken pox    History of kidney stones 1966   Hyperlipidemia    Hypertension    Kidney cysts 11/2014   R>L   Liver cyst    multiple   Smoker    Past Surgical History:  Procedure Laterality Date   ANKLE SURGERY Bilateral    L then R - fell off lader on separate occasions (Sabra Heck remotely   [place]   mult TAs (Manya Silvas MD)   COLONOSCOPY N/A 05/11/2016   TAs and SSA, rpt 3 yrs (Dr RManya Silvas   COLONOSCOPY WITH PROPOFOL N/A 06/16/2019   TA x4, no f/u recommended (Vicente Males  Bailey Mech, MD)   ESOPHAGOGASTRODUODENOSCOPY N/A 01/08/2015   gastritis, HH (Manya Silvas, MD)   Family History  Problem Relation Age of Onset   Arthritis Mother    Cancer Brother        lung   Hypertension Neg Hx    CAD Neg Hx    Stroke Neg Hx    Diabetes Neg Hx    Social History   Socioeconomic History   Marital status: Single    Spouse name: Not on file   Number of children: Not on file   Years of education: Not on file   Highest education level: Not on file  Occupational History   Not on file  Tobacco Use   Smoking status: Former    Packs/day: 0.25    Types: Cigarettes    Start date: 08/28/1964    Quit date: 12/26/2016    Years since  quitting: 5.6   Smokeless tobacco: Never   Tobacco comments:    30+ PY  Substance and Sexual Activity   Alcohol use: No    Alcohol/week: 0.0 standard drinks of alcohol   Drug use: No   Sexual activity: Never  Other Topics Concern   Not on file  Social History Narrative   Lives alone, divorced, father of Gregory Colon   Occupation: retired, was Barrister's clerk shop   Activity: yardwork - cutting wood   Diet: some water, fruits/vegetables some   Social Determinants of Radio broadcast assistant Strain: [place]  (08/17/2022)   Overall Financial Resource Strain (CARDIA)    Difficulty of Paying Living Expenses: Not hard at all  Food Insecurity: No Food [place] (08/17/2022)   Hunger Vital Sign    Worried About Running Out of Food in the Last Year: Never true    [place] in the Last Year: Never true  Transportation Needs: No Transportation Needs (08/17/2022)   PRAPARE - Hydrologist (Medical): No    Lack of Transportation (Non-Medical): No  Physical Activity: Inactive (08/17/2022)   Exercise Vital Sign    Days of Exercise per Week: 0 days    Minutes of Exercise per Session: 0 min  Stress: No Stress Concern Present (08/17/2022)   [place]    Feeling of Stress : Not at all  Social Connections: Moderately Integrated (08/17/2022)   Social Connection and Isolation Panel [NHANES]    Frequency of Communication with Friends and Family: More than three times a week    Frequency of Social Gatherings with Friends and Family: More than three times a week    Attends Religious Services: More than 4 times per year    Active Member of [employer] or Organizations: Yes    Attends Music therapist: More than 4 times per year    Marital Status: Never married    Tobacco Counseling Counseling given: Yes Tobacco comments: 30+ PY   Clinical Intake:  Pre-visit preparation  completed: No  Pain : No/denies pain     BMI - recorded: 2985 Nutritional Status: BMI 25 -29 Overweight Nutritional Risks: None Diabetes: No  How often do you need to have someone help you when you read instructions, pamphlets, or other written materials from your doctor or pharmacy?: 1 - Never  Diabetic?  No  Interpreter Needed?: No  Information entered by :: Rolene Arbour LPN   Activities of Daily Living    08/17/2022    8:49 AM  In your present state of health, do you have any difficulty performing the following activities:  Hearing? 0  Vision? 0  Difficulty concentrating or making decisions? 0  Walking or climbing stairs? 0  Dressing or bathing? 0  Doing errands, shopping? 0  Preparing Food and eating ? N  Using the Toilet? N  In the past six months, have you accidently leaked urine? N  Do you have problems with loss of bowel control? N  Managing your Medications? N  Managing your Finances? N  Housekeeping or managing your Housekeeping? N    Patient Care Team: [name], MD as PCP - General (Family Medicine) Marlowe Sax, MD (Rheumatology)  Indicate any recent Medical Services you may have received from other than [facility] providers in the past year (date may be approximate).     Assessment:   This is a routine wellness examination for Gregory Colon.  Hearing/Vision screen Hearing Screening - Comments:: Denies hearing difficulties   Vision Screening - Comments:: Wears rx glasses - up to date with routine eye exams with  [place] issues and exercise activities discussed: Current Exercise Habits: The patient does not participate in regular exercise at present, Exercise limited by: None identified   Goals Addressed               This Visit's Progress     No current goal (pt-stated)         Depression Screen    08/17/2022    8:48 AM 02/27/2022    8:44 AM 08/09/2021    8:00 AM 03/25/2020    8:15 AM 03/21/2019    7:49 AM  03/15/2018    8:43 AM 03/08/2017    8:59 AM  PHQ 2/9 Scores  PHQ - 2 Score 0 0 0 0 0 0 0  PHQ- 9 Score    0       Fall Risk    08/17/2022    8:50 AM 08/09/2021    8:00 AM 03/25/2020    8:14 AM 03/21/2019    7:49 AM 03/15/2018    8:43 AM  Fall Risk   Falls in the past year? 0 0 0 0 No  Number falls in past yr: 0  0 0   Injury with Fall? 0  0 0   Risk for fall due to : No Fall Risks  Medication side effect    Follow up Falls prevention discussed  Falls evaluation completed;Falls prevention discussed      FALL RISK PREVENTION PERTAINING TO THE HOME:  Any stairs in or around the home? No  If so, are there any without handrails? No  Home free of loose throw rugs in walkways, pet beds, electrical cords, etc? Yes  Adequate lighting in your home to reduce risk of falls? Yes   ASSISTIVE DEVICES UTILIZED TO PREVENT FALLS:  Life alert? No  Use of a cane, walker or w/c? No  Grab bars in the bathroom? No  Shower chair or bench in shower? No  Elevated toilet seat or a handicapped toilet? No   TIMED UP AND GO:  Was the test performed? No . Audio Visit   Cognitive Function:    03/25/2020    8:16 AM 03/08/2017    9:01 AM  MMSE - Mini Mental State Exam  Orientation to time 5 5  Orientation to Place 5 5  Registration 3 3  Attention/ Calculation 5 0  Recall 3 2  Language- name 2 objects  0  Language-  repeat 1 1  Language- follow 3 step command  3  Language- read & follow direction  0  Write a sentence  0  Copy design  0  Total score  19        08/17/2022    8:50 AM  6CIT Screen  What Year? 0 points  What month? 0 points  What time? 0 points  Count back from 20 0 points  Months in reverse 0 points  Repeat phrase 0 points  Total Score 0 points    Immunizations Immunization History  Administered Date(s) Administered   Fluad Quad(high Dose 65+) 07/01/2020, 06/21/2022   Influenza, High Dose Seasonal PF 06/20/2016, 06/11/2017, 06/17/2018, 06/23/2019, 06/08/2021    Influenza-Unspecified 06/28/2014   Moderna Sars-Covid-2 Vaccination 12/17/2019, 01/14/2020   Pneumococcal Conjugate-13 08/07/2014   Pneumococcal Polysaccharide-23 03/02/2016   Zoster Recombinat (Shingrix) 09/21/2021, 12/13/2021    TDAP status: Due, Education has been provided regarding the importance of this vaccine. Advised may receive this vaccine at local pharmacy or Health Dept. Aware to provide a copy of the vaccination record if obtained from local pharmacy or Health Dept. Verbalized acceptance and understanding.  Flu Vaccine status: Up to date  Pneumococcal vaccine status: Up to date  Covid-19 vaccine status: Completed vaccines  Qualifies for Shingles Vaccine? Yes   Zostavax completed Yes   Shingrix Completed?: Yes  Screening Tests Health Maintenance  Topic Date Due   DTaP/Tdap/Td (1 - Tdap) Never done   COVID-19 Vaccine (3 - 2023-24 season) 09/02/2022 (Originally 04/28/2022)   COLONOSCOPY (Pts 45-44yr Insurance coverage will need to be confirmed)  08/18/2023 (Originally 06/15/2022)   [place]  08/18/2023   Pneumonia Vaccine 76 Years old  Completed   INFLUENZA VACCINE  Completed   Hepatitis C Screening  Completed   Zoster Vaccines- Shingrix  Completed   HPV VACCINES  Aged Out    Health Maintenance  Health Maintenance Due  Topic Date Due   DTaP/Tdap/Td (1 - Tdap) Never done    Colorectal cancer screening: No longer required.   Lung Cancer Screening: (Low Dose CT Chest recommended if Age 77-80years, 30 pack-year currently smoking OR have quit w/in 15years.) does not qualify.     Additional Screening:  Hepatitis C Screening: does qualify; Completed 11/10/15  Vision Screening: Recommended annual ophthalmology exams for early detection of glaucoma and other disorders of the eye. Is the patient up to date with their annual eye exam?  Yes  Who is the provider or what is the name of the office in which the patient attends annual eye exams?  [facility], would they like to be referred to a provider to establish care? No .   Dental Screening: Recommended annual dental exams for proper oral hygiene  Community Resource Referral / Chronic Care Management:  CRR required this visit?  No   CCM required this visit?  No      Plan:     I have personally reviewed and noted the following in the patient's chart:   Medical and social history Use of alcohol, tobacco or illicit drugs  Current medications and supplements including opioid prescriptions. Patient is not currently taking opioid prescriptions. Functional ability and status Nutritional status Physical activity Advanced directives List of other physicians Hospitalizations, surgeries, and ER visits in previous 12 months Vitals Screenings to include cognitive, depression, and falls Referrals and appointments  In addition, I have reviewed and discussed with patient certain preventive protocols,  quality metrics, and best practice recommendations. A written personalized care plan for preventive services as well as general preventive health recommendations were provided to patient.     Criselda Peaches, LPN   [id number]   Nurse Notes: None

## 2022-08-17 NOTE — Patient Instructions (Addendum)
Gregory Colon , Thank you for taking time to come for your Medicare Wellness Visit. I appreciate your ongoing commitment to your health goals. Please review the following plan we discussed and let me know if I can assist you in the future.   These are the goals we discussed:  Goals       No current goal (pt-stated)      Patient Stated      03/25/2020, I will maintain and continue medications as prescribed.       Quit smoking / using tobacco (pt-stated)      Starting 03/08/2017, I will work towards quitting smoking completely.        This is a list of the screening recommended for you and due dates:  Health Maintenance  Topic Date Due   DTaP/Tdap/Td vaccine (1 - Tdap) Never done   COVID-19 Vaccine (3 - 2023-24 season) 09/02/2022*   Colon Cancer Screening  08/18/2023*   Medicare Annual Wellness Visit  08/18/2023   Pneumonia Vaccine  Completed   Flu Shot  Completed   Hepatitis C Screening: USPSTF Recommendation to screen - Ages 18-79 yo.  Completed   Zoster (Shingles) Vaccine  Completed   HPV Vaccine  Aged Out  *Topic was postponed. The date shown is not the original due date.    Advanced directives: Advance directive discussed with you today. Even though you declined this today, please call our office should you change your mind, and we can give you the proper paperwork for you to fill out.   Conditions/risks identified: None  Next appointment: Follow up in one year for your annual wellness visit.    Preventive Care 77 Years and Older, Male  Preventive care refers to lifestyle choices and visits with your health care provider that can promote health and wellness. What does preventive care include? A yearly physical exam. This is also called an annual well check. Dental exams once or twice a year. Routine eye exams. Ask your health care provider how often you should have your eyes checked. Personal lifestyle choices, including: Daily care of your teeth and gums. Regular  physical activity. Eating a healthy diet. Avoiding tobacco and drug use. Limiting alcohol use. Practicing safe sex. Taking low doses of aspirin every day. Taking vitamin and mineral supplements as recommended by your health care provider. What happens during an annual well check? The services and screenings done by your health care provider during your annual well check will depend on your age, overall health, lifestyle risk factors, and family history of disease. Counseling  Your health care provider may ask you questions about your: Alcohol use. Tobacco use. Drug use. Emotional well-being. Home and relationship well-being. Sexual activity. Eating habits. History of falls. Memory and ability to understand (cognition). Work and work Statistician. Screening  You may have the following tests or measurements: Height, weight, and BMI. Blood pressure. Lipid and cholesterol levels. These may be checked every 5 years, or more frequently if you are over 7 years old. Skin check. Lung cancer screening. You may have this screening every year starting at age 73 if you have a 30-pack-year history of smoking and currently smoke or have quit within the past 15 years. Fecal occult blood test (FOBT) of the stool. You may have this test every year starting at age 110. Flexible sigmoidoscopy or colonoscopy. You may have a sigmoidoscopy every 5 years or a colonoscopy every 10 years starting at age 65. Prostate cancer screening. Recommendations will vary depending on your  family history and other risks. Hepatitis C blood test. Hepatitis B blood test. Sexually transmitted disease (STD) testing. Diabetes screening. This is done by checking your blood sugar (glucose) after you have not eaten for a while (fasting). You may have this done every 1-3 years. Abdominal aortic aneurysm (AAA) screening. You may need this if you are a current or former smoker. Osteoporosis. You may be screened starting at age 67  if you are at high risk. Talk with your health care provider about your test results, treatment options, and if necessary, the need for more tests. Vaccines  Your health care provider may recommend certain vaccines, such as: Influenza vaccine. This is recommended every year. Tetanus, diphtheria, and acellular pertussis (Tdap, Td) vaccine. You may need a Td booster every 10 years. Zoster vaccine. You may need this after age 78. Pneumococcal 13-valent conjugate (PCV13) vaccine. One dose is recommended after age 25. Pneumococcal polysaccharide (PPSV23) vaccine. One dose is recommended after age 60. Talk to your health care provider about which screenings and vaccines you need and how often you need them. This information is not intended to replace advice given to you by your health care provider. Make sure you discuss any questions you have with your health care provider. Document Released: 09/10/2015 Document Revised: 05/03/2016 Document Reviewed: 06/15/2015 Elsevier Interactive Patient Education  2017 [place] Prevention in the Home Falls can cause injuries. They can happen to people of all ages. There are many things you can do to make your home safe and to help prevent falls. What can I do on the outside of my home? Regularly fix the edges of walkways and driveways and fix any cracks. Remove anything that might make you trip as you walk through a door, such as a raised step or threshold. Trim any bushes or trees on the path to your home. Use bright outdoor lighting. Clear any walking paths of anything that might make someone trip, such as rocks or tools. Regularly check to see if handrails are loose or broken. Make sure that both sides of any steps have handrails. Any raised decks and porches should have guardrails on the edges. Have any leaves, snow, or ice cleared regularly. Use sand or salt on walking paths during winter. Clean up any spills in your garage right away. This  includes oil or grease spills. What can I do in the bathroom? Use night lights. Install grab bars by the toilet and in the tub and shower. Do not use towel bars as grab bars. Use non-skid mats or decals in the tub or shower. If you need to sit down in the shower, use a plastic, non-slip stool. Keep the floor dry. Clean up any water that spills on the floor as soon as it happens. Remove soap buildup in the tub or shower regularly. Attach bath mats securely with double-sided non-slip rug tape. Do not have throw rugs and other things on the floor that can make you trip. What can I do in the bedroom? Use night lights. Make sure that you have a light by your bed that is easy to reach. Do not use any sheets or blankets that are too big for your bed. They should not hang down onto the floor. Have a firm chair that has side arms. You can use this for support while you get dressed. Do not have throw rugs and other things on the floor that can make you trip. What can I do in the kitchen? Clean up  any spills right away. Avoid walking on wet floors. Keep items that you use a lot in easy-to-reach places. If you need to reach something above you, use a strong step stool that has a grab bar. Keep electrical cords out of the way. Do not use floor polish or wax that makes floors slippery. If you must use wax, use non-skid floor wax. Do not have throw rugs and other things on the floor that can make you trip. What can I do with my stairs? Do not leave any items on the stairs. Make sure that there are handrails on both sides of the stairs and use them. Fix handrails that are broken or loose. Make sure that handrails are as long as the stairways. Check any carpeting to make sure that it is firmly attached to the stairs. Fix any carpet that is loose or worn. Avoid having throw rugs at the top or bottom of the stairs. If you do have throw rugs, attach them to the floor with carpet tape. Make sure that you  have a light switch at the top of the stairs and the bottom of the stairs. If you do not have them, ask someone to add them for you. What else can I do to help prevent falls? Wear shoes that: Do not have high heels. Have rubber bottoms. Are comfortable and fit you well. Are closed at the toe. Do not wear sandals. If you use a stepladder: Make sure that it is fully opened. Do not climb a closed stepladder. Make sure that both sides of the stepladder are locked into place. Ask someone to hold it for you, if possible. Clearly mark and make sure that you can see: Any grab bars or handrails. First and last steps. Where the edge of each step is. Use tools that help you move around (mobility aids) if they are needed. These include: Canes. Walkers. Scooters. Crutches. Turn on the lights when you go into a dark area. Replace any light bulbs as soon as they burn out. Set up your furniture so you have a clear path. Avoid moving your furniture around. If any of your floors are uneven, fix them. If there are any pets around you, be aware of where they are. Review your medicines with your doctor. Some medicines can make you feel dizzy. This can increase your chance of falling. Ask your doctor what other things that you can do to help prevent falls. This information is not intended to replace advice given to you by your health care provider. Make sure you discuss any questions you have with your health care provider. Document Released: 06/10/2009 Document Revised: 01/20/2016 Document Reviewed: 09/18/2014 Elsevier Interactive Patient Education  2017 [employer].

## 2023-02-18 ENCOUNTER — Other Ambulatory Visit: Payer: Self-pay | Admitting: Family Medicine

## 2023-02-18 DIAGNOSIS — Z125 Encounter for screening for malignant neoplasm of prostate: Secondary | ICD-10-CM

## 2023-02-18 DIAGNOSIS — E785 Hyperlipidemia, unspecified: Secondary | ICD-10-CM

## 2023-02-18 DIAGNOSIS — R7303 Prediabetes: Secondary | ICD-10-CM

## 2023-02-22 ENCOUNTER — Telehealth: Payer: Self-pay

## 2023-02-22 NOTE — Patient Outreach (Signed)
  Care Coordination   02/22/2023 Name: Gregory Colon MRN: [id number] DOB: 10-21-1945   Care Coordination Outreach Attempts:  Successful contact made with patient.  Patient states he doesn't have time to talk now.  RNCM offered to call patient back at a later time/ date.   Follow Up Plan:  Additional outreach attempts will be made to offer the patient care coordination information and services.   Encounter Outcome:  Pt. Request to Call Back   Care Coordination Interventions:  No, not indicated    [facility] Care Coordination [phone] direct line

## 2023-02-23 ENCOUNTER — Other Ambulatory Visit: Payer: 59

## 2023-03-02 ENCOUNTER — Encounter: Payer: 59 | Admitting: Family Medicine

## 2023-03-02 ENCOUNTER — Telehealth: Payer: Self-pay | Admitting: Family Medicine

## 2023-03-02 NOTE — Telephone Encounter (Signed)
Spoke to patient, he was not aware that he had the physical scheduled, confirmed number on file as home number being best to contact, patient stated yes  Rescheduled for 7.9.24 lab and physical for 8:30am on 7.12.24

## 2023-03-02 NOTE — Telephone Encounter (Signed)
Daughter (on [place]) returned call, informed her of patient's new lab and physical appointments next week

## 2023-03-02 NOTE — Telephone Encounter (Signed)
Left a message for the patient's daughter Crystal (on [place]) and for the patient on home phone to return my call to the office

## 2023-03-02 NOTE — Telephone Encounter (Signed)
Pt missed CPE appt today, missed lab visit last week.  He prefers to have his physicals in summer.  Can we call to offer to reschedule sometime this summer?  Per daughter he can get 1 physical per calendar year.

## 2023-03-06 ENCOUNTER — Other Ambulatory Visit (INDEPENDENT_AMBULATORY_CARE_PROVIDER_SITE_OTHER): Payer: 59

## 2023-03-06 DIAGNOSIS — E785 Hyperlipidemia, unspecified: Secondary | ICD-10-CM

## 2023-03-06 DIAGNOSIS — Z125 Encounter for screening for malignant neoplasm of prostate: Secondary | ICD-10-CM

## 2023-03-06 DIAGNOSIS — R7303 Prediabetes: Secondary | ICD-10-CM

## 2023-03-06 LAB — COMPREHENSIVE METABOLIC PANEL
ALT: 17 U/L (ref 0–53)
AST: 15 U/L (ref 0–37)
Albumin: 4.3 g/dL (ref 3.5–5.2)
Alkaline Phosphatase: 74 U/L (ref 39–117)
BUN: 14 mg/dL (ref 6–23)
CO2: 30 mEq/L (ref 19–32)
Calcium: 9.9 mg/dL (ref 8.4–10.5)
Chloride: 103 mEq/L (ref 96–112)
Creatinine, Ser: 1.17 mg/dL (ref 0.40–1.50)
GFR: 60.01 mL/min (ref >60.00)
Glucose, Bld: 102 mg/dL — ABNORMAL HIGH (ref 70–99)
Potassium: 4.9 mEq/L (ref 3.5–5.1)
Sodium: 139 mEq/L (ref 135–145)
Total Bilirubin: 0.7 mg/dL (ref 0.2–1.2)
Total Protein: 7 g/dL (ref 6.0–8.3)

## 2023-03-06 LAB — LIPID PANEL
Cholesterol: 155 mg/dL (ref 0–200)
HDL: 32 mg/dL — ABNORMAL LOW (ref >39.00)
NonHDL: 122.53
Total CHOL/HDL Ratio: 5
Triglycerides: 218 mg/dL — ABNORMAL HIGH (ref 0.0–149.0)
VLDL: 43.6 mg/dL — ABNORMAL HIGH (ref 0.0–40.0)

## 2023-03-06 LAB — LDL CHOLESTEROL, DIRECT: Direct LDL: 93 mg/dL

## 2023-03-06 LAB — HEMOGLOBIN A1C: Hgb A1c MFr Bld: 6.1 % (ref 4.6–6.5)

## 2023-03-06 LAB — PSA: PSA: 1.52 ng/mL (ref 0.10–4.00)

## 2023-03-09 ENCOUNTER — Ambulatory Visit: Payer: 59 | Admitting: Family Medicine

## 2023-03-09 ENCOUNTER — Encounter: Payer: Self-pay | Admitting: Family Medicine

## 2023-03-09 VITALS — BP 152/92 | HR 85 | Temp 97.3°F | Ht 67.0 in | Wt 196.0 lb

## 2023-03-09 DIAGNOSIS — I7143 Infrarenal abdominal aortic aneurysm, without rupture: Secondary | ICD-10-CM | POA: Diagnosis not present

## 2023-03-09 DIAGNOSIS — R7303 Prediabetes: Secondary | ICD-10-CM | POA: Diagnosis not present

## 2023-03-09 DIAGNOSIS — Z0000 Encounter for general adult medical examination without abnormal findings: Secondary | ICD-10-CM

## 2023-03-09 DIAGNOSIS — R21 Rash and other nonspecific skin eruption: Secondary | ICD-10-CM | POA: Insufficient documentation

## 2023-03-09 DIAGNOSIS — I10 Essential (primary) hypertension: Secondary | ICD-10-CM

## 2023-03-09 DIAGNOSIS — R011 Cardiac murmur, unspecified: Secondary | ICD-10-CM

## 2023-03-09 DIAGNOSIS — E785 Hyperlipidemia, unspecified: Secondary | ICD-10-CM

## 2023-03-09 MED ORDER — AMLODIPINE BESYLATE 5 MG PO TABS: 5.0000 mg | ORAL_TABLET | Freq: Every day | ORAL | 4 refills | Status: DC

## 2023-03-09 MED ORDER — ATORVASTATIN CALCIUM 40 MG PO TABS: 40.0000 mg | ORAL_TABLET | Freq: Every day | ORAL | 4 refills | Status: DC

## 2023-03-09 MED ORDER — LISINOPRIL 2.5 MG PO TABS: 2.5000 mg | ORAL_TABLET | Freq: Every day | ORAL | 4 refills | Status: DC

## 2023-03-09 NOTE — Assessment & Plan Note (Signed)
Preventative protocols reviewed and updated unless pt declined. Discussed healthy diet and lifestyle.  

## 2023-03-09 NOTE — Assessment & Plan Note (Signed)
Not appreciated today.  

## 2023-03-09 NOTE — Patient Instructions (Addendum)
We will order repeat abdominal aorta ultrasound to monitor dilation noted previously (in [place]).  Keep an eye on blood pressures at home, let me know if consistently >150/100 to change BP medicines.  Good to see you today Return in 1 year for next physical, prior fasting for labwork.

## 2023-03-09 NOTE — Assessment & Plan Note (Signed)
Diffuse throughout extremities - favors sun exposed surfaces. ?psoriasis vs other. Offered derm eval, he will consider.

## 2023-03-09 NOTE — Assessment & Plan Note (Signed)
Discussed this. Will order updated abdominal aorta ultrasound.

## 2023-03-09 NOTE — Progress Notes (Signed)
Ph: [phone] Fax: [phone]   Patient ID: Gregory Colon, male    DOB: [birth year], 77 y.o.   MRN: [id number]  This visit was conducted in person.  BP (!) 152/92 (BP Location: Right Arm, Cuff Size: Normal)   Pulse 85   Temp (!) 97.3 F (36.3 C) (Temporal)   Ht 5\' 7"  (1.702 m)   Wt 196 lb (88.9 kg)   SpO2 95%   BMI 30.70 kg/m    CC: CPE Subjective:   HPI: Gregory Colon is a 77 y.o. male presenting on 03/09/2023 for Annual Exam (MCR prt 2 [AWV- 08/17/22].)   Saw health advisor 07/2022 for medicare wellness visit. Note reviewed.   No results found.  Flowsheet Row Clinical Support from 08/17/2022 in [facility] [place]  PHQ-2 Total Score 0          08/17/2022    8:50 AM 08/09/2021    8:00 AM 03/25/2020    8:14 AM 03/21/2019    7:49 AM 03/15/2018    8:43 AM  Fall Risk   Falls in the past year? 0 0 0 0 No  Number falls in past yr: 0  0 0   Injury with Fall? 0  0 0   Risk for fall due to : No Fall Risks  Medication side effect    Follow up Falls prevention discussed  Falls evaluation completed;Falls prevention discussed      HTN - not recently checking BP at home. Continues amlodipine and lisinopril. No HA, vision changes, CP/tightness, SOB, leg swelling.   H/o mild dilatation of infrarenal AA 3.7cm (03/2020), rpt 3 yrs.   [facility] OA (seronegative arthritis vs crystalline arthropathy) followed by Dr Allena Katz. He has had steroid injections into left wrist. Saw ortho Dr Hyacinth Meeker, declined joint replacement surgery, managing with daily oral NSAID. Has not returned to see either recently.    Preventative: COLONOSCOPY WITH PROPOFOL 06/16/2019 - TA x4, no f/u recommended Tobi Bastos, Sharlet Salina, MD)  Prostate cancer screening - no fmhx. Declines DRE.  Lung cancer screening - done 10/2014. Declined rpt screening.  Flu yearly at [facility]  Moderna vaccine - 11/2019, 12/2019, booster x1  Prevnar-13 07/2014, pneumovax 02/2016  Shingrix - 08/2021, 11/2021  RSV -  discussed, declines Advanced directive - would want daughter Cristy to be HCPOA. Daughter knows what he wants. Doesn't want prolonged life support. Not interested in setting up. has previously declined packet.  Seat belt use discussed  Sunscreen use discussed. No changing moles on skin.  Sleep - averaging 9 hours/night  Ex smoker - quit 2019. 50+ yrs smoking.  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 Interim medical history since our last visit reviewed. Allergies and medications reviewed and updated. Outpatient Medications Prior to Visit  Medication Sig Dispense Refill   meloxicam (MOBIC) 15 MG tablet Take 15 mg by mouth. Daily with food     Multiple Vitamins-Minerals (MULTIVITAMIN ADULTS 50+ PO) Take 1 tablet by mouth daily.     amLODipine (NORVASC)  5 MG tablet Take 1 tablet (5 mg total) by mouth daily. 90 tablet 3   atorvastatin (LIPITOR) 40 MG tablet Take 1 tablet (40 mg total) by mouth daily. 90 tablet 3   lisinopril (ZESTRIL) 2.5 MG tablet Take 1 tablet (2.5 mg total) by mouth daily. 90 tablet 3   No facility-administered medications prior to visit.     Per HPI unless specifically indicated in ROS section below Review of Systems  Constitutional:  Negative for activity change, appetite change, chills, fatigue, fever and unexpected weight change.  HENT:  Negative for hearing loss.   Eyes:  Negative for visual disturbance.  Respiratory:  Negative for cough, chest tightness, shortness of breath and  wheezing.   Cardiovascular:  Negative for chest pain, palpitations and leg swelling.  Gastrointestinal:  Negative for abdominal distention, abdominal pain, blood in stool, constipation, diarrhea, nausea and vomiting.  Genitourinary:  Negative for difficulty urinating and hematuria.  Musculoskeletal:  Negative for arthralgias, myalgias and neck pain.  Skin:  Negative for rash.  Neurological:  Negative for dizziness, seizures, syncope and headaches.  Hematological:  Negative for adenopathy. Does not bruise/bleed easily.  Psychiatric/Behavioral:  Negative for dysphoric mood. The patient is not nervous/anxious.     Objective:  BP (!) 152/92 (BP Location: Right Arm, Cuff Size: Normal)   Pulse 85   Temp (!) 97.3 F (36.3 C) (Temporal)   Ht 5\' 7"  (1.702 m)   Wt 196 lb (88.9 kg)   SpO2 95%   BMI 30.70 kg/m   Wt Readings from Last 3 Encounters:  03/09/23 196 lb (88.9 kg)  08/17/22 192 lb (87.1 kg)  02/27/22 192 lb 6 oz (87.3 kg)      Physical Exam Vitals and nursing note reviewed.  Constitutional:      General: He is not in acute distress.    Appearance: Normal appearance. He is well-developed. He is not ill-appearing.  HENT:     Head: Normocephalic and atraumatic.     Right Ear: Hearing, tympanic membrane, ear canal and external ear normal.     Left Ear: Hearing, tympanic membrane, ear canal and external ear normal.     Nose: Nose normal.     Mouth/Throat:     Mouth: Mucous membranes are moist.     Pharynx: Oropharynx is clear. No oropharyngeal exudate or posterior oropharyngeal erythema.  Eyes:     General: No scleral icterus.    Extraocular Movements: Extraocular movements intact.     Conjunctiva/sclera: Conjunctivae normal.     Pupils: Pupils are equal, round, and reactive to light.  Neck:     Thyroid: No thyroid mass or thyromegaly.     Vascular: No carotid bruit.  Cardiovascular:     Rate and Rhythm: Normal rate and regular rhythm.     Pulses: Normal pulses.           Radial pulses are 2+ on the right side and 2+ on the left side.     Heart sounds: Normal heart sounds. No murmur heard. Pulmonary:     Effort: Pulmonary effort is normal. No respiratory distress.     Breath sounds: Normal breath sounds. No wheezing, rhonchi or rales.  Abdominal:     General: Bowel sounds are normal. There is no distension.     Palpations: Abdomen is soft. There is no mass.     Tenderness: There is no abdominal tenderness. There is no guarding or rebound.     Hernia: No hernia is present.  Musculoskeletal:  General: Normal range of motion.     Cervical back: Normal range of motion and neck supple.     Right lower leg: No edema.     Left lower leg: No edema.  Lymphadenopathy:     Cervical: No cervical adenopathy.  Skin:    General: Skin is warm and dry.     Findings: Rash present.     Comments:  Erythematous maculopapular scaling rash to bilateral extremities, few spots on back, none on abdomen. Not pruritic or tender  Neurological:     General: No focal deficit present.     Mental Status: He is alert and oriented to person, place, and time.  Psychiatric:        Mood and Affect: Mood normal.        Behavior: Behavior normal.        Thought Content: Thought content normal.        Judgment: Judgment normal.       Results for orders placed or performed in visit on 03/06/23  PSA  Result Value Ref Range   PSA 1.52 0.10 - 4.00 ng/mL  Hemoglobin A1c  Result Value Ref Range   Hgb A1c MFr Bld 6.1 4.6 - 6.5 %  Comprehensive metabolic panel  Result Value Ref Range   Sodium 139 135 - 145 mEq/L   Potassium 4.9 3.5 - 5.1 mEq/L   Chloride 103 96 - 112 mEq/L   CO2 30 19 - 32 mEq/L   Glucose, Bld 102 (H) 70 - 99 mg/dL   BUN 14 6 - 23 mg/dL   Creatinine, Ser 5.36 0.40 - 1.50 mg/dL   Total Bilirubin 0.7 0.2 - 1.2 mg/dL   Alkaline Phosphatase 74 39 - 117 U/L   AST 15 0 - 37 U/L   ALT 17 0 - 53 U/L   Total Protein 7.0 6.0 - 8.3 g/dL   Albumin 4.3 3.5 - 5.2 g/dL    GFR 64.40 >34.74 mL/min   Calcium 9.9 8.4 - 10.5 mg/dL  Lipid panel  Result Value Ref Range   Cholesterol 155 0 - 200 mg/dL   Triglycerides 259.5 (H) 0.0 - 149.0 mg/dL   HDL 63.87 (L) >56.43 mg/dL   VLDL 32.9 (H) 0.0 - 51.8 mg/dL   Total CHOL/HDL Ratio 5    NonHDL 122.53   LDL cholesterol, direct  Result Value Ref Range   Direct LDL 93.0 mg/dL    Assessment & Plan:   Problem List Items Addressed This Visit     Health maintenance examination - Primary (Chronic)    Preventative protocols reviewed and updated unless pt declined. Discussed healthy diet and lifestyle.       Primary hypertension    Chronic, mildly elevated in setting of not taking BP meds yet this morning. Continue current regimen, I asked him to monitor at home and let me know if persistently elevated to titrate antihypertensives accordingly.       Relevant Medications   amLODipine (NORVASC) 5 MG tablet   atorvastatin (LIPITOR) 40 MG tablet   lisinopril (ZESTRIL) 2.5 MG tablet   Dyslipidemia    Chronic, stable on atorvastatin. Triglycerides elevated - reviewed diet choices to keep trig under control. The 10-year ASCVD risk score (Arnett DK, et al., 2019) is: 43.9%   Values used to calculate the score:     Age: 66 years     Sex: Male     Is Non-Hispanic African American: No     Diabetic: No  Tobacco smoker: Yes     Systolic Blood Pressure: 152 mmHg     Is BP treated: Yes     HDL Cholesterol: 32 mg/dL     Total Cholesterol: 155 mg/dL       Relevant Medications   atorvastatin (LIPITOR) 40 MG tablet   AAA (abdominal aortic aneurysm) (HCC)    Discussed this. Will order updated abdominal aorta ultrasound.       Relevant Medications   amLODipine (NORVASC) 5 MG tablet   atorvastatin (LIPITOR) 40 MG tablet   lisinopril (ZESTRIL) 2.5 MG tablet   Other Relevant Orders   VAS [place] AAA DUPLEX   Prediabetes    Reviewed diet choices to keep sugar levels under control       Systolic murmur    Not  appreciated today       Skin rash    Diffuse throughout extremities - favors sun exposed surfaces. ?psoriasis vs other. Offered derm eval, he will consider.         Meds ordered this encounter  Medications   amLODipine (NORVASC) 5 MG tablet    Sig: Take 1 tablet (5 mg total) by mouth daily.    Dispense:  90 tablet    Refill:  4   atorvastatin (LIPITOR) 40 MG tablet    Sig: Take 1 tablet (40 mg total) by mouth daily.    Dispense:  90 tablet    Refill:  4   lisinopril (ZESTRIL) 2.5 MG tablet    Sig: Take 1 tablet (2.5 mg total) by mouth daily.    Dispense:  90 tablet    Refill:  4    No orders of the defined types were placed in this encounter.   Patient Instructions  We will order repeat abdominal aorta ultrasound to monitor dilation noted previously (in [place]).  Keep an eye on blood pressures at home, let me know if consistently >150/100 to change BP medicines.  Good to see you today Return in 1 year for next physical, prior fasting for labwork.   Follow up plan: Return in about 1 year (around 03/08/2024) for annual exam, prior fasting for blood work.  Eustaquio Boyden, MD

## 2023-03-09 NOTE — Assessment & Plan Note (Signed)
Chronic, stable on atorvastatin. Triglycerides elevated - reviewed diet choices to keep trig under control. The 10-year ASCVD risk score (Arnett DK, et al., 2019) is: 43.9%   Values used to calculate the score:     Age: 77 years     Sex: Male     Is Non-Hispanic African American: No     Diabetic: No     Tobacco smoker: Yes     Systolic Blood Pressure: 152 mmHg     Is BP treated: Yes     HDL Cholesterol: 32 mg/dL     Total Cholesterol: 155 mg/dL

## 2023-03-09 NOTE — Assessment & Plan Note (Addendum)
Chronic, mildly elevated in setting of not taking BP meds yet this morning. Continue current regimen, I asked him to monitor at home and let me know if persistently elevated to titrate antihypertensives accordingly.

## 2023-03-09 NOTE — Assessment & Plan Note (Signed)
Reviewed diet choices to keep sugar levels under control

## 2023-03-17 ENCOUNTER — Encounter: Payer: Self-pay | Admitting: Family Medicine

## 2023-03-17 DIAGNOSIS — I10 Essential (primary) hypertension: Secondary | ICD-10-CM

## 2023-03-20 MED ORDER — LISINOPRIL 5 MG PO TABS: 5.0000 mg | ORAL_TABLET | Freq: Every day | ORAL | 3 refills | Status: DC

## 2023-04-18 ENCOUNTER — Ambulatory Visit: Payer: 59 | Attending: Family Medicine

## 2023-04-18 DIAGNOSIS — I7143 Infrarenal abdominal aortic aneurysm, without rupture: Secondary | ICD-10-CM

## 2023-04-19 ENCOUNTER — Encounter: Payer: Self-pay | Admitting: Family Medicine

## 2023-04-19 DIAGNOSIS — I10 Essential (primary) hypertension: Secondary | ICD-10-CM

## 2023-04-20 MED ORDER — LISINOPRIL 10 MG PO TABS
10.0000 mg | ORAL_TABLET | Freq: Every day | ORAL | 3 refills | Status: DC
Start: 2023-04-20 — End: 2024-03-14

## 2023-04-20 NOTE — Addendum Note (Signed)
Addended by: Eustaquio Boyden on: 04/20/2023 05:29 PM   Modules accepted: Orders

## 2023-04-20 NOTE — Telephone Encounter (Signed)
Daughter called back states he has not told her what readings have been. States that he only checks when he has swelling does not check as much as recommended.

## 2023-04-20 NOTE — Telephone Encounter (Signed)
Called both numbers for daughter and left message to call office. I have sent my chart message as well to give updated information. Wanted to send to you to see if you wanted to refill or hold off until update.

## 2023-04-27 ENCOUNTER — Other Ambulatory Visit: Payer: Self-pay | Admitting: Family Medicine

## 2023-04-27 DIAGNOSIS — I7143 Infrarenal abdominal aortic aneurysm, without rupture: Secondary | ICD-10-CM

## 2023-05-01 ENCOUNTER — Other Ambulatory Visit (INDEPENDENT_AMBULATORY_CARE_PROVIDER_SITE_OTHER): Payer: 59

## 2023-05-01 DIAGNOSIS — I10 Essential (primary) hypertension: Secondary | ICD-10-CM | POA: Diagnosis not present

## 2023-05-01 LAB — BASIC METABOLIC PANEL
BUN: 11 mg/dL (ref 6–23)
CO2: 27 meq/L (ref 19–32)
Calcium: 9.7 mg/dL (ref 8.4–10.5)
Chloride: 104 meq/L (ref 96–112)
Creatinine, Ser: 1.09 mg/dL (ref 0.40–1.50)
GFR: 65.26 mL/min (ref >60.00)
Glucose, Bld: 114 mg/dL — ABNORMAL HIGH (ref 70–99)
Potassium: 4.1 meq/L (ref 3.5–5.1)
Sodium: 140 meq/L (ref 135–145)

## 2023-05-15 ENCOUNTER — Encounter: Payer: Self-pay | Admitting: Family Medicine

## 2023-06-05 ENCOUNTER — Encounter (INDEPENDENT_AMBULATORY_CARE_PROVIDER_SITE_OTHER): Payer: Self-pay | Admitting: Vascular Surgery

## 2023-06-05 ENCOUNTER — Ambulatory Visit (INDEPENDENT_AMBULATORY_CARE_PROVIDER_SITE_OTHER): Payer: 59 | Admitting: Vascular Surgery

## 2023-06-05 VITALS — BP 123/79 | HR 89 | Resp 18 | Ht 67.0 in | Wt 196.2 lb

## 2023-06-05 DIAGNOSIS — I7143 Infrarenal abdominal aortic aneurysm, without rupture: Secondary | ICD-10-CM | POA: Diagnosis not present

## 2023-06-05 DIAGNOSIS — I10 Essential (primary) hypertension: Secondary | ICD-10-CM | POA: Diagnosis not present

## 2023-06-05 DIAGNOSIS — E785 Hyperlipidemia, unspecified: Secondary | ICD-10-CM

## 2023-06-05 NOTE — Assessment & Plan Note (Signed)
blood pressure control important in reducing the progression of atherosclerotic disease and aneurysmal growth. On appropriate oral medications.  

## 2023-06-05 NOTE — Assessment & Plan Note (Addendum)
The patient has a 4.5 cm infrarenal abdominal aortic aneurysm.  This has grown about 8 mm over about a 3-year span.  I discussed the pathophysiology and natural history of abdominal aortic aneurysm today at some length with the patient and his daughter.  I discussed that this is below the threshold for prophylactic repair, but has certainly grown significantly and will need to be watched for growth going forward.  We discussed that rapid growth of approximately 1 cm in a 27-month span or an absolute size of 5 cm are generally felt to be appropriate reasons for prophylactic repair even if the patient remains asymptomatic.  I will plan to see him back in about 3 months with another duplex for follow-up of his aneurysm.  We also discussed getting a CT scan if this size approached 5 cm.  Blood pressure control and abstinence from tobacco were discussed as the major risk factors for avoidance growth of this aneurysm.

## 2023-06-05 NOTE — Patient Instructions (Signed)
Abdominal Aortic Aneurysm  An aneurysm is a bulge in an artery. An artery is a blood vessel that carries blood away from your heart. An abdominal aortic aneurysm happens in the main artery (aorta). Most aneurysms do not cause symptoms. But some can cause problems if they burst or tear. This can cause you to bleed inside your body. It is an emergency. It should be treated right away. What are the causes? The exact cause of an aneurysm is not known. What increases the risk? Being male and 60 years of age or older. Being Caucasian. Smoking. Being very overweight. Having: Family members who have had this condition. An injury to your main artery. Hardened arteries. Irritation and swelling of the walls of an artery. Certain genetic problems. An infection in the wall of the main artery. High cholesterol. High blood pressure. What are the signs or symptoms? You may not have any symptoms. If you do, you may have: Very bad pain in your side, back, or belly (abdomen). A feeling of fullness after you eat only a little bit of food. A lump in your belly that throbs. Problems with your feet or toes, like: Pain. Skin turning blue. Sores. Trouble pooping (constipation). Trouble peeing (urinating). If your aneurysm bursts, you may: Feel sudden, very bad pain in your belly, side, or back. Vomit or feel like you may vomit. Feel light-headed. Faint. How is this treated? Treatment depends on: Your age. How big your aneurysm is. How fast it is growing. How likely it is to burst. If the aneurysm is smaller than 2 inches (5 cm), your doctor may: Keep an eye on it to see if it gets bigger. You may need to have testing done every 6-12 months, every year, or every few years. Give you medicines for: High blood pressure. Pain. Infection. If the aneurysm is bigger than 2 inches (5 cm), you may need surgery. Follow these instructions at home: Eating and drinking  Eat foods that are good for your  heart. Eat a lot of: Fresh fruits and vegetables. Whole grains. Low-fat (lean) protein. Low-fat dairy products. Stay away from foods that are high in saturated fat and cholesterol. These include red meat and some dairy products. Lifestyle  Do not smoke or use any products that contain nicotine or tobacco. If you need help quitting, ask your doctor. Check your blood pressure often. Follow instructions from your doctor on how to keep it at a normal level. Have your cholesterol levels checked often. Follow instructions from your doctor on how to keep it at the right levels. Stay active. Get exercise. Ask your doctor how often to exercise. Ask what types of exercise are safe for you. Stay at a healthy weight. Alcohol use Do not drink alcohol if: Your doctor tells you not to drink. You are pregnant, may be pregnant, or plan to become pregnant. If you drink alcohol: Limit how much you have to: 0-1 drink a day if you are male. 0-2 drinks a day if you are male. Know how much alcohol is in your drink. In the [place] one drink is one 12 oz bottle of beer (355 mL), one 5 oz glass of wine (148 mL), or one 1 oz glass of hard liquor (44 mL). General instructions Take over-the-counter and prescription medicines only as told by your doctor. You may have to avoid lifting. Ask your doctor how much you can safely lift. If you can, learn your family's health history. Keep all follow-up visits. Your doctor will need   to keep an eye on your aneurysm and check how fast it is growing. Where to find more information American Heart Association: heart.org Contact a doctor if: Your belly, side, or back hurts. Your belly throbs. Your heart beats fast when you stand. You feel like you may vomit, or you vomit. You have trouble pooping. You have trouble peeing. You have a fever. Get help right away if: You have sudden, bad pain in your belly, side, or back. You feel light-headed, or you faint. You have  sweaty skin that is cold to the touch (clammy). You are short of breath. These symptoms may be an emergency. Get help right away. Call 911. Do not wait to see if the symptoms will go away. Do not drive yourself to the hospital. This information is not intended to replace advice given to you by your health care provider. Make sure you discuss any questions you have with your health care provider. Document Revised: 03/21/2022 Document Reviewed: 03/21/2022 Elsevier Patient Education  2024 [employer].

## 2023-06-05 NOTE — Progress Notes (Signed)
Patient ID: Gregory Colon, male   DOB: August 28, 1946, 77 y.o.   MRN: [id number]  Chief Complaint  Patient presents with   New Patient (Initial Visit)    np. consult. AAA screen (Epic 8.21.24) - increase in dilation of abdominal aorta to 4.5cm - recommend repeat ultrasound in 6 months. Will go ahead and refer to vascular surgeon. gutierrez, Brewing technologist.    HPI Gregory Colon is a 77 y.o. male.  I am asked to see the patient by Dr. Sharen Hones for evaluation of an enlarging AAA.  Patient recently had an ultrasound about 2 months ago which demonstrated a 4.5 cm infrarenal abdominal aortic aneurysm.  I have reviewed this study.  Talking with the patient and his daughter, it does not sound like they were aware of the aneurysm prior to this, but he did have an ultrasound back in 2021 which showed an approximately 3.7 cm infrarenal abdominal aortic aneurysm.  He has no current aneurysm related symptoms. Specifically, the patient denies new back or abdominal pain, or signs of peripheral embolization.    Past Medical History:  Diagnosis Date   AAA (abdominal aortic aneurysm) (HCC) 11/2014   by MRI 3cm   Arthritis    R hand   CAD (coronary artery disease) 10/2014   by CT   Emphysema of lung (HCC) 10/2014   by CT   Fatty liver 11/2014   by [place]   History of chicken pox    History of kidney stones 1966   Hyperlipidemia    Hypertension    Kidney cysts 11/2014   R>L   Liver cyst    multiple   Smoker     Past Surgical History:  Procedure Laterality Date   ANKLE SURGERY Bilateral    L then R - fell off lader on separate occasions Hyacinth Meeker) remotely   CHOLECYSTECTOMY  2000   COLONOSCOPY N/A 01/08/2015   mult TAs Scot Jun, MD)   COLONOSCOPY N/A 05/11/2016   TAs and SSA, rpt 3 yrs (Dr Scot Jun)   COLONOSCOPY WITH PROPOFOL N/A 06/16/2019   TA x4, no f/u recommended Tobi Bastos, Sharlet Salina, MD)   ESOPHAGOGASTRODUODENOSCOPY N/A 01/08/2015   gastritis, HH Scot Jun, MD)      Family History  Problem Relation Age of Onset   Arthritis Mother    Cancer Brother        lung   Hypertension Neg Hx    CAD Neg Hx    Stroke Neg Hx    Diabetes Neg Hx      Social History   Tobacco Use   Smoking status: Former    Current packs/day: 0.00    Average packs/day: 0.3 packs/day for 52.3 years (13.1 ttl pk-yrs)    Types: Cigarettes    Start date: 08/28/1964    Quit date: 12/26/2016    Years since quitting: 6.4   Smokeless tobacco: Never   Tobacco comments:    30+ PY  Substance Use Topics   Alcohol use: No    Alcohol/week: 0.0 standard drinks of alcohol   Drug use: No     No Known Allergies  Current Outpatient Medications  Medication Sig Dispense Refill   amLODipine (NORVASC) 5 MG tablet Take 1 tablet (5 mg total) by mouth daily. 90 tablet 4   atorvastatin (LIPITOR) 40 MG tablet Take 1 tablet (40 mg total) by mouth daily. 90 tablet 4   lisinopril (ZESTRIL) 10 MG tablet Take 1 tablet (10 mg total) by mouth daily.  90 tablet 3   meloxicam (MOBIC) 15 MG tablet Take 15 mg by mouth. Daily with food     Multiple Vitamins-Minerals (MULTIVITAMIN ADULTS 50+ PO) Take 1 tablet by mouth daily.     No current facility-administered medications for this visit.      REVIEW OF SYSTEMS (Negative unless checked)  Constitutional: [] Weight loss  [] Fever  [] Chills Cardiac: [] Chest pain   [] Chest pressure   [] Palpitations   [] Shortness of breath when laying flat   [] Shortness of breath at rest   [] Shortness of breath with exertion. Vascular:  [] Pain in legs with walking   [] Pain in legs at rest   [] Pain in legs when laying flat   [] Claudication   [] Pain in feet when walking  [] Pain in feet at rest  [] Pain in feet when laying flat   [] History of DVT   [] Phlebitis   [] Swelling in legs   [] Varicose veins   [] Non-healing ulcers Pulmonary:   [] Uses home oxygen   [] Productive cough   [] Hemoptysis   [] Wheeze  [x] COPD   [] Asthma Neurologic:  [] Dizziness  [] Blackouts   [] Seizures    [] History of stroke   [] History of TIA  [] Aphasia   [] Temporary blindness   [] Dysphagia   [] Weakness or numbness in arms   [] Weakness or numbness in legs Musculoskeletal:  [x] Arthritis   [] Joint swelling   [] Joint pain   [] Low back pain Hematologic:  [] Easy bruising  [] Easy bleeding   [] Hypercoagulable state   [] Anemic  [] Hepatitis Gastrointestinal:  [] Blood in stool   [] Vomiting blood  [x] Gastroesophageal reflux/heartburn   [] Abdominal pain Genitourinary:  [] Chronic kidney disease   [] Difficult urination  [] Frequent urination  [] Burning with urination   [] Hematuria Skin:  [] Rashes   [] Ulcers   [] Wounds Psychological:  [] History of anxiety   []  History of major depression.    Physical Exam BP 123/79 (BP Location: Right Arm)   Pulse 89   Resp 18   Ht 5\' 7"  (1.702 m)   Wt 196 lb 3.2 oz (89 kg)   BMI 30.73 kg/m  Gen:  WD/WN, NAD. Appears younger than stated age. Head: [place]/AT, No temporalis wasting.  Ear/Nose/Throat: Hearing grossly intact, nares w/o erythema or drainage, oropharynx w/o Erythema/Exudate Eyes: Conjunctiva clear, sclera non-icteric  Neck: trachea midline.  No JVD.  Pulmonary:  Good air movement, respirations not labored, no use of accessory muscles  Cardiac: RRR, no JVD Vascular:  Vessel Right Left  Radial Palpable Palpable                                   Gastrointestinal:. No tenderness.  Aorta not easily palpable due to body habitus. Musculoskeletal: M/S 5/5 throughout.  Extremities without ischemic changes.  No deformity or atrophy. No edema. Neurologic: Sensation grossly intact in extremities.  Symmetrical.  Speech is fluent. Motor exam as listed above. Psychiatric: Judgment intact, Mood & affect appropriate for pt's clinical situation. Dermatologic: No rashes or ulcers noted.  No cellulitis or open wounds.    Radiology No results found.  Labs Recent Results (from the past 2160 hour(s))  Basic metabolic panel     Status: Abnormal   Collection Time:  05/01/23  9:29 AM  Result Value Ref Range   Sodium 140 135 - 145 mEq/L   Potassium 4.1 3.5 - 5.1 mEq/L   Chloride 104 96 - 112 mEq/L   CO2 27 19 - 32 mEq/L   Glucose, Bld 114 (  H) 70 - 99 mg/dL   BUN 11 6 - 23 mg/dL   Creatinine, Ser 1.61 0.40 - 1.50 mg/dL   GFR 09.60 >45.40 mL/min    Comment: Calculated using the CKD-EPI Creatinine Equation (2021)   Calcium 9.7 8.4 - 10.5 mg/dL    Assessment/Plan:  Primary hypertension blood pressure control important in reducing the progression of atherosclerotic disease and aneurysmal growth. On appropriate oral medications.   Dyslipidemia lipid control important in reducing the progression of atherosclerotic disease. Continue statin therapy   AAA (abdominal aortic aneurysm) (HCC) The patient has a 4.5 cm infrarenal abdominal aortic aneurysm.  This has grown about 8 mm over about a 3-year span.  I discussed the pathophysiology and natural history of abdominal aortic aneurysm today at some length with the patient and his daughter.  I discussed that this is below the threshold for prophylactic repair, but has certainly grown significantly and will need to be watched for growth going forward.  We discussed that rapid growth of approximately 1 cm in a 76-month span or an absolute size of 5 cm are generally felt to be appropriate reasons for prophylactic repair even if the patient remains asymptomatic.  I will plan to see him back in about 3 months with another duplex for follow-up of his aneurysm.  We also discussed getting a CT scan if this size approached 5 cm.  Blood pressure control and abstinence from tobacco were discussed as the major risk factors for avoidance growth of this aneurysm.      Festus Barren 06/05/2023, 12:45 PM   This note was created with Dragon medical transcription system.  Any errors from dictation are unintentional.

## 2023-06-05 NOTE — Assessment & Plan Note (Signed)
lipid control important in reducing the progression of atherosclerotic disease. Continue statin therapy  

## 2023-08-15 ENCOUNTER — Ambulatory Visit: Payer: 59

## 2023-08-15 VITALS — Ht 67.0 in | Wt 196.0 lb

## 2023-08-15 DIAGNOSIS — Z0000 Encounter for general adult medical examination without abnormal findings: Secondary | ICD-10-CM | POA: Diagnosis not present

## 2023-08-15 NOTE — Progress Notes (Signed)
Subjective:   Gregory Colon is a 77 y.o. male who presents for Medicare Annual/Subsequent preventive examination.  Visit Complete: Virtual I connected with  Gregory Colon on 08/15/23 by a audio enabled telemedicine application and verified that I am speaking with the correct person using two identifiers.  Patient Location: Home  Provider Location: Office/Clinic  I discussed the limitations of evaluation and management by telemedicine. The patient expressed understanding and agreed to proceed.  Vital Signs: Because this visit was a virtual/telehealth visit, some criteria may be missing or patient reported. Any vitals not documented were not able to be obtained and vitals that have been documented are patient reported.  Patient Medicare AWV questionnaire was completed by the patient on (not done); I have confirmed that all information answered by patient is correct and no changes since this date.  Cardiac Risk Factors include: advanced age (>42men, >63 women);dyslipidemia;hypertension;obesity (BMI >30kg/m2);sedentary lifestyle    Objective:    Today's Vitals   08/15/23 0822  Weight: 196 lb (88.9 kg)  Height: 5\' 7"  (1.702 m)   Body mass index is 30.7 kg/m.     08/15/2023    8:29 AM 08/17/2022    8:50 AM 03/25/2020    8:14 AM 06/16/2019    8:34 AM 03/08/2017    9:00 AM 05/11/2016   12:36 PM 01/08/2015    9:44 AM  Advanced Directives  Does Patient Have a Medical Advance Directive? No No No No No No No  Would patient like information on creating a medical advance directive?  No - Patient declined No - Patient declined No - Patient declined No - Patient declined No - patient declined information No - patient declined information    Current Medications (verified) Outpatient Encounter Medications as of 08/15/2023  Medication Sig   amLODipine (NORVASC) 5 MG tablet Take 1 tablet (5 mg total) by mouth daily.   atorvastatin (LIPITOR) 40 MG tablet Take 1 tablet (40 mg total)  by mouth daily.   lisinopril (ZESTRIL) 10 MG tablet Take 1 tablet (10 mg total) by mouth daily.   meloxicam (MOBIC) 15 MG tablet Take 15 mg by mouth. Daily with food   Multiple Vitamins-Minerals (MULTIVITAMIN ADULTS 50+ PO) Take 1 tablet by mouth daily.   No facility-administered encounter medications on file as of 08/15/2023.    Allergies (verified) Patient has no known allergies.   History: Past Medical History:  Diagnosis Date   AAA (abdominal aortic aneurysm) (HCC) 11/2014   by MRI 3cm   Arthritis    R hand   CAD (coronary artery disease) 10/2014   by CT   Emphysema of lung (HCC) 10/2014   by CT   Fatty liver 11/2014   by [place]   History of chicken pox    History of kidney stones 1966   Hyperlipidemia    Hypertension    Kidney cysts 11/2014   R>L   Liver cyst    multiple   Smoker    Past Surgical History:  Procedure Laterality Date   ANKLE SURGERY Bilateral    L then R - fell off lader on separate occasions Hyacinth Meeker) remotely   CHOLECYSTECTOMY  2000   COLONOSCOPY N/A 01/08/2015   mult TAs Scot Jun, MD)   COLONOSCOPY N/A 05/11/2016   TAs and SSA, rpt 3 yrs (Dr Scot Jun)   COLONOSCOPY WITH PROPOFOL N/A 06/16/2019   TA x4, no f/u recommended Wyline Mood, MD)   ESOPHAGOGASTRODUODENOSCOPY N/A 01/08/2015   gastritis, HH Molly Maduro  Daron Offer, MD)   Family History  Problem Relation Age of Onset   Arthritis Mother    Cancer Brother        lung   Hypertension Neg Hx    CAD Neg Hx    Stroke Neg Hx    Diabetes Neg Hx    Social History   Socioeconomic History   Marital status: Single    Spouse name: Not on file   Number of children: Not on file   Years of education: Not on file   Highest education level: Not on file  Occupational History   Not on file  Tobacco Use   Smoking status: Former    Current packs/day: 0.00    Average packs/day: 0.3 packs/day for 52.3 years (13.1 ttl pk-yrs)    Types: Cigarettes    Start date: 08/28/1964    Quit date:  12/26/2016    Years since quitting: 6.6   Smokeless tobacco: Never   Tobacco comments:    30+ PY  Substance and Sexual Activity   Alcohol use: No    Alcohol/week: 0.0 standard drinks of alcohol   Drug use: No   Sexual activity: Never  Other Topics Concern   Not on file  Social History Narrative   Lives alone, divorced, father of Owens Shark   Occupation: retired, was Educational psychologist shop   Activity: yardwork - cutting wood   Diet: some water, fruits/vegetables some   Social Drivers of Corporate investment banker Strain: Low Risk  (08/15/2023)   Overall Financial Resource Strain (CARDIA)    Difficulty of Paying Living Expenses: Not hard at all  Food Insecurity: No Food Insecurity (08/15/2023)   Hunger Vital Sign    Worried About Running Out of Food in the Last Year: Never true    Ran Out of Food in the Last Year: Never true  Transportation Needs: No Transportation Needs (08/15/2023)   PRAPARE - Administrator, Civil Service (Medical): No    Lack of Transportation (Non-Medical): No  Physical Activity: Inactive (08/15/2023)   Exercise Vital Sign    Days of Exercise per Week: 0 days    Minutes of Exercise per Session: 0 min  Stress: No Stress Concern Present (08/15/2023)   [employer] of Occupational Health - Occupational Stress Questionnaire    Feeling of Stress : Not at all  Social Connections: Unknown (08/15/2023)   Social Connection and Isolation Panel [NHANES]    Frequency of Communication with Friends and Family: More than three times a week    Frequency of Social Gatherings with Friends and Family: More than three times a week    Attends Religious Services: Never    Database administrator or Organizations: No    Attends Engineer, structural: Never    Marital Status: Patient declined    Tobacco Counseling Counseling given: Not Answered Tobacco comments: 30+ PY  Clinical Intake:  Pre-visit preparation completed: Yes  Pain :  No/denies pain    BMI - recorded: 30.7 Nutritional Status: BMI > 30  Obese Nutritional Risks: None Diabetes: No  How often do you need to have someone help you when you read instructions, pamphlets, or other written materials from your doctor or pharmacy?: 2 - Rarely  Interpreter Needed?: No  Comments: lives alone Information entered by :: B.Suhayla Chisom,LPN   Activities of Daily Living    08/15/2023    8:29 AM 08/17/2022    8:49 AM  In your present state of  health, do you have any difficulty performing the following activities:  Hearing? 0 0  Vision? 0 0  Difficulty concentrating or making decisions? 0 0  Walking or climbing stairs? 0 0  Dressing or bathing? 0 0  Doing errands, shopping? 0 0  Preparing Food and eating ? N N  Using the Toilet? N N  In the past six months, have you accidently leaked urine? N N  Do you have problems with loss of bowel control? N N  Managing your Medications? N N  Managing your Finances? N N  Housekeeping or managing your Housekeeping? N N    Patient Care Team: Eustaquio Boyden, MD as PCP - General (Family Medicine) Dayna Barker, MD (Rheumatology)  Indicate any recent Medical Services you may have received from other than [facility] providers in the past year (date may be approximate).     Assessment:   This is a routine wellness examination for Gregory Colon.  Hearing/Vision screen No results found.   Goals Addressed               This Visit's Progress     COMPLETED: Patient Stated   On track     03/25/2020, I will maintain and continue medications as prescribed.       COMPLETED: Quit smoking / using tobacco (pt-stated)   On track     Starting 03/08/2017, I will work towards quitting smoking completely.       Depression Screen    08/15/2023    8:26 AM 08/17/2022    8:48 AM 02/27/2022    8:44 AM 08/09/2021    8:00 AM 03/25/2020    8:15 AM 03/21/2019    7:49 AM 03/15/2018    8:43 AM  PHQ 2/9 Scores  PHQ - 2 Score 0 0 0 0  0 0 0  PHQ- 9 Score     0      Fall Risk    08/15/2023    8:24 AM 08/17/2022    8:50 AM 08/09/2021    8:00 AM 03/25/2020    8:14 AM 03/21/2019    7:49 AM  Fall Risk   Falls in the past year? 0 0 0 0 0  Number falls in past yr: 0 0  0 0  Injury with Fall? 0 0  0 0  Risk for fall due to : No Fall Risks No Fall Risks  Medication side effect   Follow up Education provided;Falls prevention discussed Falls prevention discussed  Falls evaluation completed;Falls prevention discussed     MEDICARE RISK AT HOME: Medicare Risk at Home Any stairs in or around the home?: No If so, are there any without handrails?: No Home free of loose throw rugs in walkways, pet beds, electrical cords, etc?: Yes Adequate lighting in your home to reduce risk of falls?: Yes Life alert?: No Use of a cane, walker or w/c?: No Grab bars in the bathroom?: No Shower chair or bench in shower?: No Elevated toilet seat or a handicapped toilet?: No  TIMED UP AND GO:  Was the test performed?  No    Cognitive Function:    03/25/2020    8:16 AM 03/08/2017    9:01 AM  MMSE - Mini Mental State Exam  Orientation to time 5 5  Orientation to Place 5 5  Registration 3 3  Attention/ Calculation 5 0  Recall 3 2  Language- name 2 objects  0  Language- repeat 1 1  Language- follow 3 step command  3  Language- read & follow direction  0  Write a sentence  0  Copy design  0  Total score  19        08/15/2023    8:31 AM 08/17/2022    8:50 AM  6CIT Screen  What Year? 0 points 0 points  What month? 0 points 0 points  What time? 0 points 0 points  Count back from 20 0 points 0 points  Months in reverse 4 points 0 points  Repeat phrase 2 points 0 points  Total Score 6 points 0 points    Immunizations Immunization History  Administered Date(s) Administered   Fluad Quad(high Dose 65+) 07/01/2020, 06/21/2022   Influenza, High Dose Seasonal PF 06/20/2016, 06/11/2017, 06/17/2018, 06/23/2019, 06/08/2021, 06/05/2023    Influenza-Unspecified 06/28/2014   Moderna Sars-Covid-2 Vaccination 12/17/2019, 01/14/2020   Pneumococcal Conjugate-13 08/07/2014   Pneumococcal Polysaccharide-23 03/02/2016   Zoster Recombinant(Shingrix) 09/21/2021, 12/13/2021    TDAP status: Up to date  Flu Vaccine status: Up to date  Pneumococcal vaccine status: Up to date  Covid-19 vaccine status: Completed vaccines  Qualifies for Shingles Vaccine? Yes   Zostavax completed Yes   Shingrix Completed?: Yes  Screening Tests Health Maintenance  Topic Date Due   DTaP/Tdap/Td (1 - Tdap) Never done   COVID-19 Vaccine (3 - 2024-25 season) 04/29/2023   Colonoscopy  08/18/2023 (Originally 06/15/2022)   Medicare Annual Wellness (AWV)  08/14/2024   Pneumonia Vaccine 70+ Years old  Completed   INFLUENZA VACCINE  Completed   Hepatitis C Screening  Completed   Zoster Vaccines- Shingrix  Completed   HPV VACCINES  Aged Out    Health Maintenance  Health Maintenance Due  Topic Date Due   DTaP/Tdap/Td (1 - Tdap) Never done   COVID-19 Vaccine (3 - 2024-25 season) 04/29/2023    Colorectal cancer screening: No longer required.   Lung Cancer Screening: (Low Dose CT Chest recommended if Age 22-80 years, 20 pack-year currently smoking OR have quit w/in 15years.) does not qualify.   Lung Cancer Screening Referral: no  Additional Screening:  Hepatitis C Screening: does not qualify; Completed 11/10/2015  Vision Screening: Recommended annual ophthalmology exams for early detection of glaucoma and other disorders of the eye. Is the patient up to date with their annual eye exam?  No  will schedule Who is the provider or what is the name of the office in which the patient attends annual eye exams? [facility] If pt is not established with a provider, would they like to be referred to a provider to establish care? No .    Dental Screening: Recommended annual dental exams for proper oral hygiene  Diabetic Foot Exam: n/a  Community Resource  Referral / Chronic Care Management: CRR required this visit?  No   CCM required this visit?  No    Plan:     I have personally reviewed and noted the following in the patient's chart:   Medical and social history Use of alcohol, tobacco or illicit drugs  Current medications and supplements including opioid prescriptions. Patient is not currently taking opioid prescriptions. Functional ability and status Nutritional status Physical activity Advanced directives List of other physicians Hospitalizations, surgeries, and ER visits in previous 12 months Vitals Screenings to include cognitive, depression, and// falls// Referrals and appointments  In addition, I have reviewed and discussed with patient certain preventive protocols, quality metrics, and best practice recommendations. A written personalized care plan for preventive services as well as general preventive health recommendations were provided to patient.  Sue Lush, LPN   [id number]/05/[id number]   After Visit Summary: (MyChart) Due to this being a telephonic visit, the after visit summary with patients personalized plan was offered to patient via MyChart   Nurse Notes: The patient states he is doing well and has no concerns or questions at this time.

## 2023-08-15 NOTE — Patient Instructions (Signed)
Mr. Fruin , Thank you for taking time to come for your Medicare Wellness Visit. I appreciate your ongoing commitment to your health goals. Please review the following plan we discussed and let me know if I can assist you in the future.   Referrals/Orders/Follow-Ups/Clinician Recommendations: none  This is a list of the screening recommended for you and due dates:  Health Maintenance  Topic Date Due   DTaP/Tdap/Td vaccine (1 - Tdap) Never done   COVID-19 Vaccine (3 - 2024-25 season) 04/29/2023   Colon Cancer Screening  08/18/2023*   Medicare Annual Wellness Visit  08/14/2024   Pneumonia Vaccine  Completed   Flu Shot  Completed   Hepatitis C Screening  Completed   Zoster (Shingles) Vaccine  Completed   HPV Vaccine  Aged Out  *Topic was postponed. The date shown is not the original due date.    Advanced directives: (Declined) Advance directive discussed with you today. Even though you declined this today, please call our office should you change your mind, and we can give you the proper paperwork for you to fill out.  Next Medicare Annual Wellness Visit scheduled for next year: Yes 08/15/2024 @ 8:10am telephone/video

## 2023-09-04 ENCOUNTER — Ambulatory Visit (INDEPENDENT_AMBULATORY_CARE_PROVIDER_SITE_OTHER): Payer: 59

## 2023-09-04 ENCOUNTER — Encounter (INDEPENDENT_AMBULATORY_CARE_PROVIDER_SITE_OTHER): Payer: Self-pay | Admitting: Vascular Surgery

## 2023-09-04 ENCOUNTER — Ambulatory Visit (INDEPENDENT_AMBULATORY_CARE_PROVIDER_SITE_OTHER): Payer: 59 | Admitting: Vascular Surgery

## 2023-09-04 VITALS — BP 154/84 | HR 79 | Resp 18 | Ht 67.0 in | Wt 200.4 lb

## 2023-09-04 DIAGNOSIS — E785 Hyperlipidemia, unspecified: Secondary | ICD-10-CM | POA: Diagnosis not present

## 2023-09-04 DIAGNOSIS — I7143 Infrarenal abdominal aortic aneurysm, without rupture: Secondary | ICD-10-CM | POA: Diagnosis not present

## 2023-09-04 DIAGNOSIS — I7133 Infrarenal abdominal aortic aneurysm, ruptured: Secondary | ICD-10-CM

## 2023-09-04 DIAGNOSIS — I10 Essential (primary) hypertension: Secondary | ICD-10-CM

## 2023-09-04 NOTE — Assessment & Plan Note (Signed)
 Duplex today shows significant growth of his abdominal aortic aneurysm.  It measures 5.0 cm by duplex today.  This was 4.5 cm only about 5 months ago.  At this point, we need to get a CT scan of the abdomen pelvis to confirm the findings of significant growth as well as to assess the anatomy for endovascular versus open repair.  I had a long discussion with he and his daughter today regarding the serious nature of the situation.  He now appears to have reached the threshold for repair by both rapid growth as well as total size.  We will see him back after the CT angiogram to discuss the results and determine further treatment options.

## 2023-09-04 NOTE — Progress Notes (Signed)
 MRN : [id number]  Gregory Colon is a 78 y.o. (1945/09/18) male who presents with chief complaint of  Chief Complaint  Patient presents with   Follow-up    3 month AAA  .  History of Present Illness: Patient returns today in follow up of his abdominal aortic aneurysm.  He denies any aneurysm related symptoms. Specifically, the patient denies new back or abdominal pain, or signs of peripheral embolization.  Had no major issues or changes in his health since his last visit about 4 months ago. Duplex today shows significant growth of his abdominal aortic aneurysm.  It measures 5.0 cm by duplex today.  This was 4.5 cm only about 5 months ago.   Current Outpatient Medications  Medication Sig Dispense Refill   amLODipine  (NORVASC ) 5 MG tablet Take 1 tablet (5 mg total) by mouth daily. 90 tablet 4   atorvastatin  (LIPITOR) 40 MG tablet Take 1 tablet (40 mg total) by mouth daily. 90 tablet 4   lisinopril  (ZESTRIL ) 10 MG tablet Take 1 tablet (10 mg total) by mouth daily. 90 tablet 3   meloxicam (MOBIC) 15 MG tablet Take 15 mg by mouth. Daily with food     Multiple Vitamins-Minerals (MULTIVITAMIN ADULTS 50+ PO) Take 1 tablet by mouth daily.     No current facility-administered medications for this visit.    Past Medical History:  Diagnosis Date   AAA (abdominal aortic aneurysm) (HCC) 11/2014   by MRI 3cm   Arthritis    R hand   CAD (coronary artery disease) 10/2014   by CT   Emphysema of lung (HCC) 10/2014   by CT   Fatty liver 11/2014   by US    History of chicken pox    History of kidney stones 1966   Hyperlipidemia    Hypertension    Kidney cysts 11/2014   R>L   Liver cyst    multiple   Smoker     Past Surgical History:  Procedure Laterality Date   ANKLE SURGERY Bilateral    L then R - fell off lader on separate occasions Ted) remotely   CHOLECYSTECTOMY  2000   COLONOSCOPY N/A 01/08/2015   mult TAs Beverlie ONEIDA Holmes, MD)   COLONOSCOPY N/A 05/11/2016   TAs and SSA,  rpt 3 yrs (Dr Lamar ONEIDA Holmes)   COLONOSCOPY WITH PROPOFOL  N/A 06/16/2019   TA x4, no f/u recommended Romero, Ruel, MD)   ESOPHAGOGASTRODUODENOSCOPY N/A 01/08/2015   gastritis, HH Beverlie ONEIDA Holmes, MD)     Social History   Tobacco Use   Smoking status: Former    Current packs/day: 0.00    Average packs/day: 0.3 packs/day for 52.3 years (13.1 ttl pk-yrs)    Types: Cigarettes    Start date: 08/28/1964    Quit date: 12/26/2016    Years since quitting: 6.6   Smokeless tobacco: Never   Tobacco comments:    30+ PY  Substance Use Topics   Alcohol use: No    Alcohol/week: 0.0 standard drinks of alcohol   Drug use: No      Family History  Problem Relation Age of Onset   Arthritis Mother    Cancer Brother        lung   Hypertension Neg Hx    CAD Neg Hx    Stroke Neg Hx    Diabetes Neg Hx      No Known Allergies  REVIEW OF SYSTEMS (Negative unless checked)   Constitutional: [] Weight loss  [] Fever  []   Chills Cardiac: [] Chest pain   [] Chest pressure   [] Palpitations   [] Shortness of breath when laying flat   [] Shortness of breath at rest   [] Shortness of breath with exertion. Vascular:  [] Pain in legs with walking   [] Pain in legs at rest   [] Pain in legs when laying flat   [] Claudication   [] Pain in feet when walking  [] Pain in feet at rest  [] Pain in feet when laying flat   [] History of DVT   [] Phlebitis   [] Swelling in legs   [] Varicose veins   [] Non-healing ulcers Pulmonary:   [] Uses home oxygen   [] Productive cough   [] Hemoptysis   [] Wheeze  [x] COPD   [] Asthma Neurologic:  [] Dizziness  [] Blackouts   [] Seizures   [] History of stroke   [] History of TIA  [] Aphasia   [] Temporary blindness   [] Dysphagia   [] Weakness or numbness in arms   [] Weakness or numbness in legs Musculoskeletal:  [x] Arthritis   [] Joint swelling   [] Joint pain   [] Low back pain Hematologic:  [] Easy bruising  [] Easy bleeding   [] Hypercoagulable state   [] Anemic  [] Hepatitis Gastrointestinal:  [] Blood in stool    [] Vomiting blood  [x] Gastroesophageal reflux/heartburn   [] Abdominal pain Genitourinary:  [] Chronic kidney disease   [] Difficult urination  [] Frequent urination  [] Burning with urination   [] Hematuria Skin:  [] Rashes   [] Ulcers   [] Wounds Psychological:  [] History of anxiety   []  History of major depression.  Physical Examination  BP (!) 154/84   Pulse 79   Resp 18   Ht 5' 7 (1.702 m)   Wt 200 lb 6.4 oz (90.9 kg)   BMI 31.39 kg/m  Gen:  WD/WN, NAD Head: [place]/AT, No temporalis wasting. Ear/Nose/Throat: Hearing grossly intact, nares w/o erythema or drainage Eyes: Conjunctiva clear. Sclera non-icteric Neck: Supple.  Trachea midline Pulmonary:  Good air movement, no use of accessory muscles.  Cardiac: RRR, no JVD Vascular:  Vessel Right Left  Radial Palpable Palpable                                   Gastrointestinal: soft, non-tender/non-distended. No guarding/reflex.  Musculoskeletal: M/S 5/5 throughout.  No deformity or atrophy. Trace LE edema. Neurologic: Sensation grossly intact in extremities.  Symmetrical.  Speech is fluent.  Psychiatric: Judgment intact, Mood & affect appropriate for pt's clinical situation. Dermatologic: No rashes or ulcers noted.  No cellulitis or open wounds.      Labs No results found for this or any previous visit (from the past 2160 hours).  Radiology No results found.  Assessment/Plan  AAA (abdominal aortic aneurysm) (HCC) Duplex today shows significant growth of his abdominal aortic aneurysm.  It measures 5.0 cm by duplex today.  This was 4.5 cm only about 5 months ago.  At this point, we need to get a CT scan of the abdomen pelvis to confirm the findings of significant growth as well as to assess the anatomy for endovascular versus open repair.  I had a long discussion with he and his daughter today regarding the serious nature of the situation.  He now appears to have reached the threshold for repair by both rapid growth as well as  total size.  We will see him back after the CT angiogram to discuss the results and determine further treatment options.  Primary hypertension blood pressure control important in reducing the progression of atherosclerotic disease and aneurysmal growth. On appropriate  oral medications.     Dyslipidemia lipid control important in reducing the progression of atherosclerotic disease. Continue statin therapy  Selinda Gu, MD  09/04/2023 9:45 AM    This note was created with Dragon medical transcription system.  Any errors from dictation are purely unintentional

## 2023-09-04 NOTE — Patient Instructions (Signed)
Abdominal Aortic Aneurysm  An aneurysm is a bulge in an artery. An artery is a blood vessel that carries blood away from your heart. An abdominal aortic aneurysm happens in the main artery (aorta). Most aneurysms do not cause symptoms. But some can cause problems if they burst or tear. This can cause you to bleed inside your body. It is an emergency. It should be treated right away. What are the causes? The exact cause of an aneurysm is not known. What increases the risk? Being male and 60 years of age or older. Being Caucasian. Smoking. Being very overweight. Having: Family members who have had this condition. An injury to your main artery. Hardened arteries. Irritation and swelling of the walls of an artery. Certain genetic problems. An infection in the wall of the main artery. High cholesterol. High blood pressure. What are the signs or symptoms? You may not have any symptoms. If you do, you may have: Very bad pain in your side, back, or belly (abdomen). A feeling of fullness after you eat only a little bit of food. A lump in your belly that throbs. Problems with your feet or toes, like: Pain. Skin turning blue. Sores. Trouble pooping (constipation). Trouble peeing (urinating). If your aneurysm bursts, you may: Feel sudden, very bad pain in your belly, side, or back. Vomit or feel like you may vomit. Feel light-headed. Faint. How is this treated? Treatment depends on: Your age. How big your aneurysm is. How fast it is growing. How likely it is to burst. If the aneurysm is smaller than 2 inches (5 cm), your doctor may: Keep an eye on it to see if it gets bigger. You may need to have testing done every 6-12 months, every year, or every few years. Give you medicines for: High blood pressure. Pain. Infection. If the aneurysm is bigger than 2 inches (5 cm), you may need surgery. Follow these instructions at home: Eating and drinking  Eat foods that are good for your  heart. Eat a lot of: Fresh fruits and vegetables. Whole grains. Low-fat (lean) protein. Low-fat dairy products. Stay away from foods that are high in saturated fat and cholesterol. These include red meat and some dairy products. Lifestyle  Do not smoke or use any products that contain nicotine or tobacco. If you need help quitting, ask your doctor. Check your blood pressure often. Follow instructions from your doctor on how to keep it at a normal level. Have your cholesterol levels checked often. Follow instructions from your doctor on how to keep it at the right levels. Stay active. Get exercise. Ask your doctor how often to exercise. Ask what types of exercise are safe for you. Stay at a healthy weight. Alcohol use Do not drink alcohol if: Your doctor tells you not to drink. You are pregnant, may be pregnant, or plan to become pregnant. If you drink alcohol: Limit how much you have to: 0-1 drink a day if you are male. 0-2 drinks a day if you are male. Know how much alcohol is in your drink. In the [place] one drink is one 12 oz bottle of beer (355 mL), one 5 oz glass of wine (148 mL), or one 1 oz glass of hard liquor (44 mL). General instructions Take over-the-counter and prescription medicines only as told by your doctor. You may have to avoid lifting. Ask your doctor how much you can safely lift. If you can, learn your family's health history. Keep all follow-up visits. Your doctor will need   to keep an eye on your aneurysm and check how fast it is growing. Where to find more information American Heart Association: heart.org Contact a doctor if: Your belly, side, or back hurts. Your belly throbs. Your heart beats fast when you stand. You feel like you may vomit, or you vomit. You have trouble pooping. You have trouble peeing. You have a fever. Get help right away if: You have sudden, bad pain in your belly, side, or back. You feel light-headed, or you faint. You have  sweaty skin that is cold to the touch (clammy). You are short of breath. These symptoms may be an emergency. Get help right away. Call 911. Do not wait to see if the symptoms will go away. Do not drive yourself to the hospital. This information is not intended to replace advice given to you by your health care provider. Make sure you discuss any questions you have with your health care provider. Document Revised: 03/21/2022 Document Reviewed: 03/21/2022 Elsevier Patient Education  2024 [employer].

## 2023-09-12 ENCOUNTER — Ambulatory Visit
Admission: RE | Admit: 2023-09-12 | Discharge: 2023-09-12 | Disposition: A | Discharge status: Home / Self Care | Payer: 59 | Source: Ambulatory Visit | Attending: Vascular Surgery | Admitting: Vascular Surgery

## 2023-09-12 DIAGNOSIS — I7133 Infrarenal abdominal aortic aneurysm, ruptured: Secondary | ICD-10-CM | POA: Insufficient documentation

## 2023-09-12 DIAGNOSIS — Z01818 Encounter for other preprocedural examination: Secondary | ICD-10-CM | POA: Diagnosis not present

## 2023-09-12 DIAGNOSIS — Q272 Other congenital malformations of renal artery: Secondary | ICD-10-CM | POA: Diagnosis not present

## 2023-09-12 DIAGNOSIS — I7143 Infrarenal abdominal aortic aneurysm, without rupture: Secondary | ICD-10-CM | POA: Diagnosis not present

## 2023-09-12 DIAGNOSIS — N281 Cyst of kidney, acquired: Secondary | ICD-10-CM | POA: Diagnosis not present

## 2023-09-12 MED ORDER — IOHEXOL 350 MG/ML SOLN
100.0000 mL | Freq: Once | INTRAVENOUS | Status: AC | PRN
  Administered 2023-09-12: 100 mL via INTRAVENOUS

## 2023-10-05 ENCOUNTER — Encounter (INDEPENDENT_AMBULATORY_CARE_PROVIDER_SITE_OTHER): Payer: Self-pay | Admitting: Nurse Practitioner

## 2023-10-05 ENCOUNTER — Ambulatory Visit (INDEPENDENT_AMBULATORY_CARE_PROVIDER_SITE_OTHER): Payer: 59 | Admitting: Vascular Surgery

## 2023-10-05 VITALS — BP 136/84 | HR 91 | Resp 18 | Ht 67.0 in | Wt 199.2 lb

## 2023-10-05 DIAGNOSIS — I7143 Infrarenal abdominal aortic aneurysm, without rupture: Secondary | ICD-10-CM

## 2023-10-05 DIAGNOSIS — I10 Essential (primary) hypertension: Secondary | ICD-10-CM | POA: Diagnosis not present

## 2023-10-05 DIAGNOSIS — E785 Hyperlipidemia, unspecified: Secondary | ICD-10-CM

## 2023-10-05 NOTE — Assessment & Plan Note (Signed)
 I have independently reviewed his CT angiogram of the abdomen and pelvis.  Although the official report was of a 4.8 cm abdominal aortic aneurysm, this may be the maximal diameter in the AP direction but clearly in the transverse projection had this measured at 5.0 or 5.1 cm in multiple slices.  That would correlate with his duplex findings and he has had significant growth of his aneurysm up now to this 5 cm mark.  He does have a good infrarenal neck for stent placement and generally fairly favorable anatomy for an endovascular repair.  This also represents about 5 mm of growth in the last 6 months.  For these reasons with an aneurysm now at the 5 cm threshold with rapid growth, repair is indicated.  I have had a long discussion today with the patient and his daughter regarding endovascular abdominal aortic aneurysm repair.  We discussed how this differs from the traditional open surgical repair.  We discussed the risks and benefits of the procedure.  They voiced their understanding and desire to proceed with endovascular abdominal aortic aneurysm repair.

## 2023-10-05 NOTE — Patient Instructions (Signed)
 Abdominal Aortic Aneurysm Endograft Repair Abdominal aortic aneurysm endograft repair is surgery to fix an abdominal aortic aneurysm (AAA). An aneurysm is a bulge in an artery. It happens when blood pushes against a weak or damaged artery wall. An AAA happens in the lower part of the main artery of the body (aorta). This repair may be done if the AAA: Causes pain in your back, abdomen, or side. Gets so large it might burst (rupture). A rupture is a medical emergency. It can cause bleeding inside your body. During the repair, a tube made of fabric and metal mesh (endograft or stent-graft) is put in the weak part of the aorta to fix it. Tell a health care provider about: Any allergies you have. All medicines you are taking, including vitamins, herbs, eye drops, creams, and over-the-counter medicines. Any problems you or family members have had with anesthesia. Any bleeding problems you have. Any surgeries you have had. Any medical conditions you have. Whether you are pregnant or may be pregnant. What are the risks? Your health care provider will talk with you about risks. These may include: Infection. Bleeding. Allergic reactions to medicines. Damage to nearby structures or organs. Blood leaking out around the graft. The endograft moving from where it was placed. Blocked blood flow through the graft. Other problems may include: Kidney problems. Blood clots. Blocked blood flow to the legs. This is rare. Rupture of the aorta, even after an endograft repair. This is rare. What happens before the procedure? When to stop eating and drinking Follow instructions from your provider about what you may eat and drink. These may include: 8 hours before your procedure Stop eating most foods. Do not eat meat, fried foods, or fatty foods. Eat only light foods, such as toast or crackers. All liquids are okay except energy drinks and alcohol. 6 hours before your procedure Stop eating. Drink only  clear liquids, such as water, clear fruit juice, black coffee, plain tea, and sports drinks. Do not drink energy drinks or alcohol. 2 hours before your procedure Stop drinking all liquids. You may be allowed to take medicines with small sips of water. If you do not follow your provider's instructions, your procedure may be delayed or canceled. Medicines Ask your provider about: Changing or stopping your regular medicines. These include any diabetes medicines or blood thinners you take. Taking medicines such as aspirin and ibuprofen. These medicines can thin your blood. Do not take them unless your provider tells you to. Taking over-the-counter medicines, vitamins, herbs, and supplements. Tests You may have tests done. These may include: Blood tests. Electrocardiogram (ECG). This test checks your heart rhythm. A stress test, if you have signs of heart problems. Imaging tests, such as ultrasound, CT scan, or MRI. These may be done to see where the aneurysm is and how big it is. Surgery safety Ask your provider: How your surgery site will be marked. What steps will be taken to help prevent infection. These steps may include: Removing hair at the surgery site. Washing skin with a soap that kills germs. Taking antibiotics. General instructions Do not use any products that contain nicotine or tobacco for at least 4 weeks before the procedure. These products include cigarettes, chewing tobacco, and vaping devices, such as e-cigarettes. If you need help quitting, ask your provider. If you will be going home right after the procedure, plan to have a responsible adult: Take you home from the hospital. You will not be allowed to drive. Care for you for the  time you are told. What happens during the procedure?  An IV will be inserted into one of your veins. You may be given: A sedative. This helps you relax. Anesthesia. This keeps you from feeling pain. It will make you fall asleep for  surgery. Small incisions or a puncture will be made on one or both sides of your groin. Soft tubes (catheters) will be passed through the incisions. They will be moved up into the aneurysm in the aorta. With the help of X-ray pictures, your provider will guide the graft through the catheter to the aneurysm. The graft will be released to seal off the aneurysm and line the aorta. It will stay in place. It will not be taken out. X-rays will be used to make sure that the graft is correctly placed. The catheter will be taken out. The incision may be closed with stitches (sutures), skin glue, or tape strips. It may be covered with a bandage (dressing). The procedure may vary among providers and hospitals. What happens after the procedure? Your blood pressure, heart rate, breathing rate, and blood oxygen level will be monitored until you leave the hospital or clinic. You may be given pain medicine as needed. You will lie flat for a number of hours, without bending your legs. When told, you should get up and move around a number of times each day. Slowly become more active. Imaging tests may be done. These will check that the endograft was put in the right spot and that it is working like it should. Wear compression stockings as told by your provider. These stockings help to prevent blood clots and reduce swelling in your legs. This information is not intended to replace advice given to you by your health care provider. Make sure you discuss any questions you have with your health care provider. Document Revised: 03/21/2022 Document Reviewed: 03/21/2022 Elsevier Patient Education  2024 [employer].

## 2023-10-05 NOTE — Progress Notes (Addendum)
 MRN : [id number]  Gregory Colon is a 78 y.o. (Mar 22, 1946) male who presents with chief complaint of  Chief Complaint  Patient presents with   Follow-up    fu CT scan done  .  History of Present Illness: Patient returns today in follow up of his abdominal aortic aneurysm.  He is very angry and upset that I was late to clinic today as I was dealing with a hospital issue.  He does not have any new aneurysm related symptoms. Specifically, the patient denies new back or abdominal pain, or signs of peripheral embolization.  I have independently reviewed his CT angiogram of the abdomen and pelvis.  Although the official report was of a 4.8 cm abdominal aortic aneurysm, this may be the maximal diameter in the AP direction but clearly in the transverse projection had this measured at 5.0 or 5.1 cm in multiple slices.  That would correlate with his duplex findings and he has had significant growth of his aneurysm up now to this 5 cm mark.  He does have a good infrarenal neck for stent placement and generally fairly favorable anatomy for an endovascular repair.  No current facility-administered medications for this visit.   No current outpatient medications on file.   Facility-Administered Medications Ordered in Other Visits  Medication Dose Route Frequency Provider Last Rate Last Admin   ceFAZolin  (ANCEF ) IVPB 2g/100 mL premix  2 g Intravenous On Call to OR Brown, Fallon E, NP       chlorhexidine  (PERIDEX ) 0.12 % solution 15 mL  15 mL Mouth/Throat Once Vicci Camellia Glatter, MD       Or   Oral care mouth rinse  15 mL Mouth Rinse Once Vicci Camellia Glatter, MD       Chlorhexidine  Gluconate Cloth 2 % PADS 6 each  6 each Topical Once Brown, Fallon E, NP       And   Chlorhexidine  Gluconate Cloth 2 % PADS 6 each  6 each Topical Once Brown, Fallon E, NP       HYDROmorphone  (DILAUDID ) injection 1 mg  1 mg Intravenous Once PRN Brown, Fallon E, NP       lactated ringers  infusion   Intravenous  Continuous Vicci Camellia Glatter, MD       ondansetron  (ZOFRAN ) injection 4 mg  4 mg Intravenous Q6H PRN Delores Orvin BRAVO, NP        Past Medical History:  Diagnosis Date   AAA (abdominal aortic aneurysm) (HCC) 11/2014   by MRI 3cm   Arthritis    R hand   Benign essential hypertension    Coronary artery calcification seen on CT scan 10/2014   Emphysema of lung (HCC) 10/2014   by CT   Fatty liver 11/2014   by US    History of chicken pox    History of kidney stones 1966   Hyperlipidemia    Hypertension    Kidney cysts 11/2014   R>L   Liver cyst    multiple   Smoker     Past Surgical History:  Procedure Laterality Date   ANKLE SURGERY Bilateral    L then R - fell off lader on separate occasions Ted) remotely   CHOLECYSTECTOMY  2000   COLONOSCOPY N/A 01/08/2015   mult TAs Beverlie ONEIDA Holmes, MD)   COLONOSCOPY N/A 05/11/2016   TAs and SSA, rpt 3 yrs (Dr Lamar ONEIDA Holmes)   COLONOSCOPY WITH PROPOFOL  N/A 06/16/2019   TA x4, no f/u recommended Romero, [place],  MD)   ESOPHAGOGASTRODUODENOSCOPY N/A 01/08/2015   gastritis, HH Beverlie ONEIDA Holmes, MD)     Social History   Tobacco Use   Smoking status: Former    Current packs/day: 0.00    Average packs/day: 0.3 packs/day for 52.3 years (13.1 ttl pk-yrs)    Types: Cigarettes    Start date: 08/28/1964    Quit date: 12/26/2016    Years since quitting: 6.9   Smokeless tobacco: Never   Tobacco comments:    30+ PY  Substance Use Topics   Alcohol use: No    Alcohol/week: 0.0 standard drinks of alcohol   Drug use: No       Family History  Problem Relation Age of Onset   Arthritis Mother    Cancer Brother        lung   Hypertension Neg Hx    CAD Neg Hx    Stroke Neg Hx    Diabetes Neg Hx      No Known Allergies   REVIEW OF SYSTEMS (Negative unless checked)   Constitutional: [] Weight loss  [] Fever  [] Chills Cardiac: [] Chest pain   [] Chest pressure   [] Palpitations   [] Shortness of breath when laying flat    [] Shortness of breath at rest   [] Shortness of breath with exertion. Vascular:  [] Pain in legs with walking   [] Pain in legs at rest   [] Pain in legs when laying flat   [] Claudication   [] Pain in feet when walking  [] Pain in feet at rest  [] Pain in feet when laying flat   [] History of DVT   [] Phlebitis   [] Swelling in legs   [] Varicose veins   [] Non-healing ulcers Pulmonary:   [] Uses home oxygen   [] Productive cough   [] Hemoptysis   [] Wheeze  [x] COPD   [] Asthma Neurologic:  [] Dizziness  [] Blackouts   [] Seizures   [] History of stroke   [] History of TIA  [] Aphasia   [] Temporary blindness   [] Dysphagia   [] Weakness or numbness in arms   [] Weakness or numbness in legs Musculoskeletal:  [x] Arthritis   [] Joint swelling   [] Joint pain   [] Low back pain Hematologic:  [] Easy bruising  [] Easy bleeding   [] Hypercoagulable state   [] Anemic  [] Hepatitis Gastrointestinal:  [] Blood in stool   [] Vomiting blood  [x] Gastroesophageal reflux/heartburn   [] Abdominal pain Genitourinary:  [] Chronic kidney disease   [] Difficult urination  [] Frequent urination  [] Burning with urination   [] Hematuria Skin:  [] Rashes   [] Ulcers   [] Wounds Psychological:  [] History of anxiety   []  History of major depression.  Physical Examination  BP 136/84   Pulse 91   Resp 18   Ht 5' 7 (1.702 m)   Wt 199 lb 3.2 oz (90.4 kg)   BMI 31.20 kg/m  Gen:  WD/WN, NAD Head: [place]/AT, No temporalis wasting. Ear/Nose/Throat: Hearing grossly intact, nares w/o erythema or drainage Eyes: Conjunctiva clear. Sclera non-icteric Neck: Supple.  Trachea midline Pulmonary:  Good air movement, no use of accessory muscles.  Cardiac: RRR, no JVD Vascular:  Vessel Right Left  Radial Palpable Palpable                                   Gastrointestinal: soft, non-tender/non-distended. No guarding/reflex. Increased aortic impulse Musculoskeletal: M/S 5/5 throughout.  No deformity or atrophy. Trace LE edema. Neurologic: Sensation grossly intact in  extremities.  Symmetrical.  Speech is fluent.  Psychiatric: Judgment intact, Mood & affect appropriate  for pt's clinical situation. Dermatologic: No rashes or ulcers noted.  No cellulitis or open wounds.      Labs Recent Results (from the past 2160 hours)  Basic metabolic panel     Status: Abnormal   Collection Time: 11/13/23  9:24 AM  Result Value Ref Range   Sodium 137 135 - 145 mmol/L   Potassium 4.2 3.5 - 5.1 mmol/L   Chloride 106 98 - 111 mmol/L   CO2 26 22 - 32 mmol/L   Glucose, Bld 102 (H) 70 - 99 mg/dL    Comment: Glucose reference range applies only to samples taken after fasting for at least 8 hours.   BUN 16 8 - 23 mg/dL   Creatinine, Ser 8.91 0.61 - 1.24 mg/dL   Calcium  9.9 8.9 - 10.3 mg/dL   GFR, Estimated >39 >39 mL/min    Comment: (NOTE) Calculated using the CKD-EPI Creatinine Equation (2021)    Anion gap 5 5 - 15    Comment: Performed at [facility], [address] [place], [place] [address]  Surgical pcr screen     Status: None   Collection Time: 11/13/23  9:24 AM   Specimen: Nasal Mucosa; Nasal Swab  Result Value Ref Range   MRSA, PCR NEGATIVE NEGATIVE   Staphylococcus aureus NEGATIVE NEGATIVE    Comment: (NOTE) The Xpert SA Assay (FDA approved for NASAL specimens in patients 35 years of age and older), is one component of a comprehensive surveillance program. It is not intended to diagnose infection nor to guide or monitor treatment. Performed at [facility], [address] [place], [place] [address]   Type and screen     Status: None   Collection Time: 11/13/23  9:24 AM  Result Value Ref Range   ABO/RH(D) O POS    Antibody Screen NEG    Sample Expiration 11/27/2023,2359    Extend sample reason      NO TRANSFUSIONS OR PREGNANCY IN THE PAST 3 MONTHS Performed at [facility], [address] [place], [place] [address]   CBC with Differential/Platelet     Status: Abnormal   Collection Time: 11/13/23  9:24 AM   Result Value Ref Range   WBC 8.7 4.0 - 10.5 K/uL   RBC 4.76 4.22 - 5.81 MIL/uL   Hemoglobin 15.2 13.0 - 17.0 g/dL   HCT 56.1 60.9 - 47.9 %   MCV 92.0 80.0 - 100.0 fL   MCH 31.9 26.0 - 34.0 pg   MCHC 34.7 30.0 - 36.0 g/dL   RDW 86.2 88.4 - 84.4 %   Platelets 268 150 - 400 K/uL   nRBC 0.0 0.0 - 0.2 %   Neutrophils Relative % 59 %   Neutro Abs 5.2 1.7 - 7.7 K/uL   Lymphocytes Relative 22 %   Lymphs Abs 1.9 0.7 - 4.0 K/uL   Monocytes Relative 14 %   Monocytes Absolute 1.2 (H) 0.1 - 1.0 K/uL   Eosinophils Relative 3 %   Eosinophils Absolute 0.3 0.0 - 0.5 K/uL   Basophils Relative 1 %   Basophils Absolute 0.1 0.0 - 0.1 K/uL   Immature Granulocytes 1 %   Abs Immature Granulocytes 0.05 0.00 - 0.07 K/uL    Comment: Performed at [facility], [address] [place], [place] [address]    Radiology No results found.   Assessment/Plan  AAA (abdominal aortic aneurysm) (HCC) I have independently reviewed his CT angiogram of the abdomen and pelvis.  Although the official report was  of a 4.8 cm abdominal aortic aneurysm, this may be the maximal diameter in the AP direction but clearly in the transverse projection had this measured at 5.0 or 5.1 cm in multiple slices.  That would correlate with his duplex findings and he has had significant growth of his aneurysm up now to this 5 cm mark.  He does have a good infrarenal neck for stent placement and generally fairly favorable anatomy for an endovascular repair.  This also represents about 5 mm of growth in the last 6 months.  For these reasons with an aneurysm now at the 5 cm threshold with rapid growth, repair is indicated.  I have had a long discussion today with the patient and his daughter regarding endovascular abdominal aortic aneurysm repair.  We discussed how this differs from the traditional open surgical repair.  We discussed the risks and benefits of the procedure.  They voiced their understanding and desire to proceed with  endovascular abdominal aortic aneurysm repair.  Primary hypertension blood pressure control important in reducing the progression of atherosclerotic disease and aneurysmal growth. On appropriate oral medications.     Dyslipidemia lipid control important in reducing the progression of atherosclerotic disease. Continue statin therapy  Selinda Gu, MD  11/21/2023 11:31 AM    This note was created with Dragon medical transcription system.  Any errors from dictation are purely unintentional

## 2023-10-17 DIAGNOSIS — I10 Essential (primary) hypertension: Secondary | ICD-10-CM | POA: Diagnosis not present

## 2023-10-17 DIAGNOSIS — E782 Mixed hyperlipidemia: Secondary | ICD-10-CM | POA: Diagnosis not present

## 2023-10-17 DIAGNOSIS — Z01818 Encounter for other preprocedural examination: Secondary | ICD-10-CM | POA: Diagnosis not present

## 2023-10-23 DIAGNOSIS — Z01818 Encounter for other preprocedural examination: Secondary | ICD-10-CM | POA: Diagnosis not present

## 2023-10-23 DIAGNOSIS — I10 Essential (primary) hypertension: Secondary | ICD-10-CM | POA: Diagnosis not present

## 2023-10-27 DIAGNOSIS — Z9889 Other specified postprocedural states: Secondary | ICD-10-CM | POA: Insufficient documentation

## 2023-11-07 ENCOUNTER — Telehealth (INDEPENDENT_AMBULATORY_CARE_PROVIDER_SITE_OTHER): Payer: Self-pay

## 2023-11-07 NOTE — Telephone Encounter (Signed)
 Spoke with the patient's daughter and he is scheduled with Dr. Wyn Quaker for AAA repair on 11/21/23 at the [facility]. Arrival time is 11:30 am. Pre-op is on 11/13/23 at 9:00 am at the [facility]. Pre-surgical instructions were discussed and will be sent to Mychart and mailed.

## 2023-11-09 DIAGNOSIS — E782 Mixed hyperlipidemia: Secondary | ICD-10-CM | POA: Diagnosis not present

## 2023-11-09 DIAGNOSIS — Z01818 Encounter for other preprocedural examination: Secondary | ICD-10-CM | POA: Diagnosis not present

## 2023-11-09 DIAGNOSIS — I10 Essential (primary) hypertension: Secondary | ICD-10-CM | POA: Diagnosis not present

## 2023-11-13 ENCOUNTER — Encounter: Payer: Self-pay | Admitting: Urgent Care

## 2023-11-13 ENCOUNTER — Encounter
Admission: RE | Admit: 2023-11-13 | Discharge: 2023-11-13 | Disposition: A | Discharge status: Home / Self Care | Source: Ambulatory Visit | Attending: Vascular Surgery | Admitting: Vascular Surgery

## 2023-11-13 ENCOUNTER — Other Ambulatory Visit: Payer: Self-pay

## 2023-11-13 ENCOUNTER — Other Ambulatory Visit (INDEPENDENT_AMBULATORY_CARE_PROVIDER_SITE_OTHER): Payer: Self-pay | Admitting: Nurse Practitioner

## 2023-11-13 VITALS — BP 142/84 | HR 89 | Resp 16 | Wt 195.1 lb

## 2023-11-13 DIAGNOSIS — I714 Abdominal aortic aneurysm, without rupture, unspecified: Secondary | ICD-10-CM

## 2023-11-13 DIAGNOSIS — Z01818 Encounter for other preprocedural examination: Secondary | ICD-10-CM | POA: Diagnosis not present

## 2023-11-13 DIAGNOSIS — I7143 Infrarenal abdominal aortic aneurysm, without rupture: Secondary | ICD-10-CM | POA: Insufficient documentation

## 2023-11-13 DIAGNOSIS — Z01812 Encounter for preprocedural laboratory examination: Secondary | ICD-10-CM

## 2023-11-13 DIAGNOSIS — I10 Essential (primary) hypertension: Secondary | ICD-10-CM

## 2023-11-13 HISTORY — DX: Essential (primary) hypertension: I10

## 2023-11-13 LAB — CBC WITH DIFFERENTIAL/PLATELET
Abs Immature Granulocytes: 0.05 10*3/uL (ref 0.00–0.07)
Basophils Absolute: 0.1 10*3/uL (ref 0.0–0.1)
Basophils Relative: 1 %
Eosinophils Absolute: 0.3 10*3/uL (ref 0.0–0.5)
Eosinophils Relative: 3 %
HCT: 43.8 % (ref 39.0–52.0)
Hemoglobin: 15.2 g/dL (ref 13.0–17.0)
Immature Granulocytes: 1 %
Lymphocytes Relative: 22 %
Lymphs Abs: 1.9 10*3/uL (ref 0.7–4.0)
MCH: 31.9 pg (ref 26.0–34.0)
MCHC: 34.7 g/dL (ref 30.0–36.0)
MCV: 92 fL (ref 80.0–100.0)
Monocytes Absolute: 1.2 10*3/uL — ABNORMAL HIGH (ref 0.1–1.0)
Monocytes Relative: 14 %
Neutro Abs: 5.2 10*3/uL (ref 1.7–7.7)
Neutrophils Relative %: 59 %
Platelets: 268 10*3/uL (ref 150–400)
RBC: 4.76 MIL/uL (ref 4.22–5.81)
RDW: 13.7 % (ref 11.5–15.5)
WBC: 8.7 10*3/uL (ref 4.0–10.5)
nRBC: 0 % (ref 0.0–0.2)

## 2023-11-13 LAB — TYPE AND SCREEN
ABO/RH(D): O POS
Antibody Screen: NEGATIVE

## 2023-11-13 LAB — BASIC METABOLIC PANEL
Anion gap: 5 (ref 5–15)
BUN: 16 mg/dL (ref 8–23)
CO2: 26 mmol/L (ref 22–32)
Calcium: 9.9 mg/dL (ref 8.9–10.3)
Chloride: 106 mmol/L (ref 98–111)
Creatinine, Ser: 1.08 mg/dL (ref 0.61–1.24)
GFR, Estimated: >60 mL/min (ref >60)
Glucose, Bld: 102 mg/dL — ABNORMAL HIGH (ref 70–99)
Potassium: 4.2 mmol/L (ref 3.5–5.1)
Sodium: 137 mmol/L (ref 135–145)

## 2023-11-13 LAB — SURGICAL PCR SCREEN
MRSA, PCR: NEGATIVE
Staphylococcus aureus: NEGATIVE

## 2023-11-13 NOTE — Patient Instructions (Addendum)
 Your procedure is scheduled on: 11/21/23 - Wednesday Report to the Registration Desk on the 1st floor of the [facility] at 11:30 am  REMEMBER: Instructions that are not followed completely may result in serious medical risk, up to and including death; or upon the discretion of your surgeon and anesthesiologist your surgery may need to be rescheduled.  Do not eat food after midnight the night before surgery.  No gum chewing or hard candies.  You may however, drink CLEAR liquids up to 2 hours before you are scheduled to arrive for your surgery. Do not drink anything within 2 hours of your scheduled arrival time.  Clear liquids include: - water  - apple juice without pulp - gatorade (not RED colors) - black coffee or tea (Do NOT add milk or creamers to the coffee or tea) Do NOT drink anything that is not on this list.  One week prior to surgery: Stop beginning 11/14/23, Anti-inflammatories (NSAIDS) such as meloxicam (MOBIC) ,Advil, Aleve, Ibuprofen, Motrin, Naproxen, Naprosyn and Aspirin based products such as Excedrin, Goody's Powder, BC Powder. You may take Tylenol if needed for pain up until the day of surgery.  Stop taking beginning 11/14/23, ANY OVER THE COUNTER supplements until after surgery : Multiple Vitamins-Minerals   HOLD lisinopril (ZESTRIL) on the day of surgery.  ON THE DAY OF SURGERY ONLY TAKE THESE MEDICATIONS WITH SIPS OF WATER:  amLODipine (NORVASC)  atorvastatin (LIPITOR)    No Alcohol for 24 hours before or after surgery.  No Smoking including e-cigarettes for 24 hours before surgery.  No chewable tobacco products for at least 6 hours before surgery.  No nicotine patches on the day of surgery.  Do not use any "recreational" drugs for at least a week (preferably 2 weeks) before your surgery.  Please be advised that the combination of cocaine and anesthesia may have negative outcomes, up to and including death. If you test positive for cocaine, your surgery  will be cancelled.  On the morning of surgery brush your teeth with toothpaste and water, you may rinse your mouth with mouthwash if you wish. Do not swallow any toothpaste or mouthwash.  Use CHG Soap or wipes as directed on instruction sheet.  Do not wear jewelry, make-up, hairpins, clips or nail polish.  For welded (permanent) jewelry: bracelets, anklets, waist bands, etc.  Please have this removed prior to surgery.  If it is not removed, there is a chance that hospital personnel will need to cut it off on the day of surgery.  Do not wear lotions, powders, or perfumes.   Do not shave body hair from the neck down 48 hours before surgery.  Contact lenses, hearing aids and dentures may not be worn into surgery.  Do not bring valuables to the hospital. [facility] is not responsible for any missing/lost belongings or valuables.   Notify your doctor if there is any change in your medical condition (cold, fever, infection).  Wear comfortable clothing (specific to your surgery type) to the hospital.  After surgery, you can help prevent lung complications by doing breathing exercises.  Take deep breaths and cough every 1-2 hours. Your doctor may order a device called an Incentive Spirometer to help you take deep breaths.  When coughing or sneezing, hold a pillow firmly against your incision with both hands. This is called "splinting." Doing this helps protect your incision. It also decreases belly discomfort.  If you are being admitted to the hospital overnight, leave your suitcase in the car. After  surgery it may be brought to your room.  In case of increased patient census, it may be necessary for you, the patient, to continue your postoperative care in the Same Day Surgery department.  If you are being discharged the day of surgery, you will not be allowed to drive home. You will need a responsible individual to drive you home and stay with you for 24 hours after surgery.   If you are  taking public transportation, you will need to have a responsible individual with you.  Please call the Pre-admissions Testing Dept. at [phone] if you have any questions about these instructions.  Surgery Visitation Policy:  Patients having surgery or a procedure may have two visitors.  Children under the age of 70 must have an adult with them who is not the patient.  Temporary Visitor Restrictions Due to increasing cases of flu, RSV and COVID-19: Children ages 84 and under will not be able to visit patients in [facility] hospitals under most circumstances.  Inpatient Visitation:    Visiting hours are 7 a.m. to 8 p.m. Up to four visitors are allowed at one time in a patient room. The visitors may rotate out with other people during the day.  One visitor age 58 or older may stay with the patient overnight and must be in the room by 8 p.m.     Preparing for Surgery with CHLORHEXIDINE GLUCONATE (CHG) Soap  Chlorhexidine Gluconate (CHG) Soap  o An antiseptic cleaner that kills germs and bonds with the skin to continue killing germs even after washing  o Used for showering the night before surgery and morning of surgery  Before surgery, you can play an important role by reducing the number of germs on your skin.  CHG (Chlorhexidine gluconate) soap is an antiseptic cleanser which kills germs and bonds with the skin to continue killing germs even after washing.  Please do not use if you have an allergy to CHG or antibacterial soaps. If your skin becomes reddened/irritated stop using the CHG.  1. Shower the NIGHT BEFORE SURGERY and the MORNING OF SURGERY with CHG soap.  2. If you choose to wash your hair, wash your hair first as usual with your normal shampoo.  3. After shampooing, rinse your hair and body thoroughly to remove the shampoo.  4. Use CHG as you would any other liquid soap. You can apply CHG directly to the skin and wash gently with a scrungie or a clean  washcloth.  5. Apply the CHG soap to your body only from the neck down. Do not use on open wounds or open sores. Avoid contact with your eyes, ears, mouth, and genitals (private parts). Wash face and genitals (private parts) with your normal soap.  6. Wash thoroughly, paying special attention to the area where your surgery will be performed.  7. Thoroughly rinse your body with warm water.  8. Do not shower/wash with your normal soap after using and rinsing off the CHG soap.  9. Pat yourself dry with a clean towel.  10. Wear clean pajamas to bed the night before surgery.  12. Place clean sheets on your bed the night of your first shower and do not sleep with pets.  13. Shower again with the CHG soap on the day of surgery prior to arriving at the hospital.  14. Do not apply any deodorants/lotions/powders.  15. Please wear clean clothes to the hospital.

## 2023-11-15 ENCOUNTER — Encounter: Payer: Self-pay | Admitting: Vascular Surgery

## 2023-11-21 ENCOUNTER — Other Ambulatory Visit: Payer: Self-pay

## 2023-11-21 ENCOUNTER — Inpatient Hospital Stay: Admitting: Certified Registered"

## 2023-11-21 ENCOUNTER — Inpatient Hospital Stay
Admission: RE | Admit: 2023-11-21 | Discharge: 2023-11-22 | DRG: 269 | Disposition: A | Discharge status: Home / Self Care | Attending: Vascular Surgery | Admitting: Vascular Surgery

## 2023-11-21 ENCOUNTER — Inpatient Hospital Stay: Admit: 2023-11-21 | Admitting: Vascular Surgery

## 2023-11-21 ENCOUNTER — Encounter
Admission: RE | Disposition: A | Discharge status: Home / Self Care | Payer: Self-pay | Source: Home / Self Care | Attending: Vascular Surgery

## 2023-11-21 ENCOUNTER — Inpatient Hospital Stay: Payer: Self-pay | Admitting: Urgent Care

## 2023-11-21 ENCOUNTER — Encounter: Payer: Self-pay | Admitting: Vascular Surgery

## 2023-11-21 DIAGNOSIS — Z9049 Acquired absence of other specified parts of digestive tract: Secondary | ICD-10-CM | POA: Diagnosis not present

## 2023-11-21 DIAGNOSIS — M19041 Primary osteoarthritis, right hand: Secondary | ICD-10-CM | POA: Diagnosis present

## 2023-11-21 DIAGNOSIS — J439 Emphysema, unspecified: Secondary | ICD-10-CM | POA: Diagnosis present

## 2023-11-21 DIAGNOSIS — E785 Hyperlipidemia, unspecified: Secondary | ICD-10-CM | POA: Diagnosis present

## 2023-11-21 DIAGNOSIS — I714 Abdominal aortic aneurysm, without rupture, unspecified: Principal | ICD-10-CM

## 2023-11-21 DIAGNOSIS — I10 Essential (primary) hypertension: Secondary | ICD-10-CM | POA: Diagnosis present

## 2023-11-21 DIAGNOSIS — Z8261 Family history of arthritis: Secondary | ICD-10-CM | POA: Diagnosis not present

## 2023-11-21 DIAGNOSIS — K760 Fatty (change of) liver, not elsewhere classified: Secondary | ICD-10-CM | POA: Diagnosis present

## 2023-11-21 DIAGNOSIS — Z87442 Personal history of urinary calculi: Secondary | ICD-10-CM | POA: Diagnosis not present

## 2023-11-21 DIAGNOSIS — I713 Abdominal aortic aneurysm, ruptured, unspecified: Principal | ICD-10-CM | POA: Diagnosis present

## 2023-11-21 DIAGNOSIS — I739 Peripheral vascular disease, unspecified: Secondary | ICD-10-CM | POA: Diagnosis present

## 2023-11-21 DIAGNOSIS — Z87891 Personal history of nicotine dependence: Secondary | ICD-10-CM | POA: Diagnosis not present

## 2023-11-21 DIAGNOSIS — I2510 Atherosclerotic heart disease of native coronary artery without angina pectoris: Secondary | ICD-10-CM | POA: Diagnosis present

## 2023-11-21 DIAGNOSIS — J449 Chronic obstructive pulmonary disease, unspecified: Secondary | ICD-10-CM | POA: Diagnosis not present

## 2023-11-21 HISTORY — PX: ENDOVASCULAR STENT GRAFT (AAA): CATH118280

## 2023-11-21 LAB — BASIC METABOLIC PANEL
Anion gap: 6 (ref 5–15)
BUN: 11 mg/dL (ref 8–23)
CO2: 26 mmol/L (ref 22–32)
Calcium: 8.7 mg/dL — ABNORMAL LOW (ref 8.9–10.3)
Chloride: 107 mmol/L (ref 98–111)
Creatinine, Ser: 0.95 mg/dL (ref 0.61–1.24)
GFR, Estimated: >60 mL/min (ref >60)
Glucose, Bld: 125 mg/dL — ABNORMAL HIGH (ref 70–99)
Potassium: 4.1 mmol/L (ref 3.5–5.1)
Sodium: 139 mmol/L (ref 135–145)

## 2023-11-21 LAB — GLUCOSE, CAPILLARY: Glucose-Capillary: 130 mg/dL — ABNORMAL HIGH (ref 70–99)

## 2023-11-21 LAB — APTT: aPTT: >200 s (ref 24–36)

## 2023-11-21 LAB — ABO/RH: ABO/RH(D): O POS

## 2023-11-21 LAB — CBC
HCT: 39.4 % (ref 39.0–52.0)
Hemoglobin: 13.6 g/dL (ref 13.0–17.0)
MCH: 31.9 pg (ref 26.0–34.0)
MCHC: 34.5 g/dL (ref 30.0–36.0)
MCV: 92.3 fL (ref 80.0–100.0)
Platelets: 232 10*3/uL (ref 150–400)
RBC: 4.27 MIL/uL (ref 4.22–5.81)
RDW: 13.5 % (ref 11.5–15.5)
WBC: 11.2 10*3/uL — ABNORMAL HIGH (ref 4.0–10.5)
nRBC: 0 % (ref 0.0–0.2)

## 2023-11-21 LAB — PROTIME-INR
INR: 1.2 (ref 0.8–1.2)
Prothrombin Time: 15.8 s — ABNORMAL HIGH (ref 11.4–15.2)

## 2023-11-21 LAB — MAGNESIUM: Magnesium: 1.8 mg/dL (ref 1.7–2.4)

## 2023-11-21 LAB — MRSA NEXT GEN BY PCR, NASAL: MRSA by PCR Next Gen: NOT DETECTED

## 2023-11-21 SURGERY — INSERTION, ENDOVASCULAR STENT GRAFT, AORTA, ABDOMINAL
Anesthesia: General

## 2023-11-21 SURGERY — ENDOVASCULAR STENT GRAFT (AAA)
Anesthesia: General

## 2023-11-21 MED ORDER — CHLORHEXIDINE GLUCONATE CLOTH 2 % EX PADS
6.0000 | MEDICATED_PAD | Freq: Once | CUTANEOUS | Status: AC
  Administered 2023-11-21: 6 via TOPICAL

## 2023-11-21 MED ORDER — LABETALOL HCL 5 MG/ML IV SOLN: 10.0000 mg | INTRAVENOUS | Status: DC | PRN

## 2023-11-21 MED ORDER — MAGNESIUM SULFATE 2 GM/50ML IV SOLN: 2.0000 g | Freq: Every day | INTRAVENOUS | Status: DC | PRN

## 2023-11-21 MED ORDER — DEXAMETHASONE SODIUM PHOSPHATE 10 MG/ML IJ SOLN
INTRAMUSCULAR | Status: DC | PRN
  Administered 2023-11-21: 5 mg via INTRAVENOUS

## 2023-11-21 MED ORDER — ROCURONIUM BROMIDE 100 MG/10ML IV SOLN
INTRAVENOUS | Status: DC | PRN
  Administered 2023-11-21: 50 mg via INTRAVENOUS

## 2023-11-21 MED ORDER — ONDANSETRON HCL 4 MG/2ML IJ SOLN: 4.0000 mg | Freq: Four times a day (QID) | INTRAMUSCULAR | Status: DC | PRN

## 2023-11-21 MED ORDER — HEPARIN SODIUM (PORCINE) 1000 UNIT/ML IJ SOLN
INTRAMUSCULAR | Status: DC | PRN
  Administered 2023-11-21: 6000 [IU] via INTRAVENOUS

## 2023-11-21 MED ORDER — FAMOTIDINE IN NACL 20-0.9 MG/50ML-% IV SOLN
20.0000 mg | Freq: Two times a day (BID) | INTRAVENOUS | Status: DC
  Administered 2023-11-21: 20 mg via INTRAVENOUS
  Filled 2023-11-21: qty 50

## 2023-11-21 MED ORDER — CLOPIDOGREL BISULFATE 75 MG PO TABS
75.0000 mg | ORAL_TABLET | Freq: Every day | ORAL | Status: DC
  Administered 2023-11-22: 75 mg via ORAL
  Filled 2023-11-21: qty 1

## 2023-11-21 MED ORDER — PROPOFOL 10 MG/ML IV BOLUS
INTRAVENOUS | Status: AC
  Filled 2023-11-21: qty 40

## 2023-11-21 MED ORDER — PHENOL 1.4 % MT LIQD: 1.0000 | OROMUCOSAL | Status: DC | PRN

## 2023-11-21 MED ORDER — ASPIRIN 325 MG PO TABS
325.0000 mg | ORAL_TABLET | ORAL | Status: AC
Start: 2023-11-21 — End: 2023-11-22
  Administered 2023-11-22: 325 mg via ORAL
  Filled 2023-11-21 (×2): qty 1

## 2023-11-21 MED ORDER — POTASSIUM CHLORIDE CRYS ER 20 MEQ PO TBCR: 20.0000 meq | EXTENDED_RELEASE_TABLET | Freq: Every day | ORAL | Status: DC | PRN

## 2023-11-21 MED ORDER — LISINOPRIL 5 MG PO TABS
10.0000 mg | ORAL_TABLET | Freq: Every day | ORAL | Status: DC
  Administered 2023-11-21 – 2023-11-22 (×2): 10 mg via ORAL
  Filled 2023-11-21 (×2): qty 2

## 2023-11-21 MED ORDER — GUAIFENESIN-DM 100-10 MG/5ML PO SYRP: 15.0000 mL | ORAL_SOLUTION | ORAL | Status: DC | PRN

## 2023-11-21 MED ORDER — CEFAZOLIN SODIUM-DEXTROSE 2-4 GM/100ML-% IV SOLN
INTRAVENOUS | Status: AC
  Filled 2023-11-21: qty 100

## 2023-11-21 MED ORDER — SODIUM CHLORIDE 0.9 % IV SOLN: 500.0000 mL | Freq: Once | INTRAVENOUS | Status: DC | PRN

## 2023-11-21 MED ORDER — LABETALOL HCL 5 MG/ML IV SOLN
INTRAVENOUS | Status: DC | PRN
  Administered 2023-11-21: 5 mg via INTRAVENOUS

## 2023-11-21 MED ORDER — DOPAMINE-DEXTROSE 3.2-5 MG/ML-% IV SOLN
3.0000 ug/kg/min | INTRAVENOUS | Status: DC
Start: 2023-11-21 — End: 2023-11-22

## 2023-11-21 MED ORDER — PROPOFOL 10 MG/ML IV BOLUS
INTRAVENOUS | Status: DC | PRN
  Administered 2023-11-21: 120 mg via INTRAVENOUS

## 2023-11-21 MED ORDER — HYDRALAZINE HCL 20 MG/ML IJ SOLN: 5.0000 mg | INTRAMUSCULAR | Status: DC | PRN

## 2023-11-21 MED ORDER — CHLORHEXIDINE GLUCONATE CLOTH 2 % EX PADS: 6.0000 | MEDICATED_PAD | Freq: Once | CUTANEOUS | Status: DC

## 2023-11-21 MED ORDER — ENOXAPARIN SODIUM 40 MG/0.4ML IJ SOSY
40.0000 mg | PREFILLED_SYRINGE | INTRAMUSCULAR | Status: DC
  Administered 2023-11-22: 40 mg via SUBCUTANEOUS
  Filled 2023-11-21: qty 0.4

## 2023-11-21 MED ORDER — ASPIRIN 81 MG PO TBEC: 81.0000 mg | DELAYED_RELEASE_TABLET | Freq: Every day | ORAL | Status: DC

## 2023-11-21 MED ORDER — NITROGLYCERIN IN D5W 200-5 MCG/ML-% IV SOLN: 5.0000 ug/min | INTRAVENOUS | Status: DC

## 2023-11-21 MED ORDER — IODIXANOL 320 MG/ML IV SOLN
INTRAVENOUS | Status: DC | PRN
  Administered 2023-11-21: 65 mL via INTRA_ARTERIAL

## 2023-11-21 MED ORDER — CHLORHEXIDINE GLUCONATE CLOTH 2 % EX PADS: 6.0000 | MEDICATED_PAD | Freq: Every day | CUTANEOUS | Status: DC

## 2023-11-21 MED ORDER — DEXAMETHASONE SODIUM PHOSPHATE 10 MG/ML IJ SOLN
INTRAMUSCULAR | Status: AC
  Filled 2023-11-21: qty 1

## 2023-11-21 MED ORDER — MORPHINE SULFATE (PF) 2 MG/ML IV SOLN: 2.0000 mg | INTRAVENOUS | Status: DC | PRN

## 2023-11-21 MED ORDER — SUGAMMADEX SODIUM 200 MG/2ML IV SOLN
INTRAVENOUS | Status: AC
  Filled 2023-11-21: qty 2

## 2023-11-21 MED ORDER — HEPARIN (PORCINE) IN NACL 1000-0.9 UT/500ML-% IV SOLN
INTRAVENOUS | Status: DC | PRN
  Administered 2023-11-21: 1000 mL

## 2023-11-21 MED ORDER — FENTANYL CITRATE (PF) 100 MCG/2ML IJ SOLN
INTRAMUSCULAR | Status: AC
  Filled 2023-11-21: qty 2

## 2023-11-21 MED ORDER — ASPIRIN 81 MG PO TBEC
81.0000 mg | DELAYED_RELEASE_TABLET | Freq: Every day | ORAL | Status: DC
  Administered 2023-11-22: 81 mg via ORAL

## 2023-11-21 MED ORDER — ACETAMINOPHEN 325 MG PO TABS: 325.0000 mg | ORAL_TABLET | ORAL | Status: DC | PRN

## 2023-11-21 MED ORDER — FENTANYL CITRATE (PF) 100 MCG/2ML IJ SOLN: 25.0000 ug | INTRAMUSCULAR | Status: DC | PRN

## 2023-11-21 MED ORDER — ATORVASTATIN CALCIUM 20 MG PO TABS
40.0000 mg | ORAL_TABLET | Freq: Every day | ORAL | Status: DC
  Administered 2023-11-22: 40 mg via ORAL
  Filled 2023-11-21 (×2): qty 2

## 2023-11-21 MED ORDER — PHENYLEPHRINE 80 MCG/ML (10ML) SYRINGE FOR IV PUSH (FOR BLOOD PRESSURE SUPPORT)
PREFILLED_SYRINGE | INTRAVENOUS | Status: DC | PRN
  Administered 2023-11-21: 160 ug via INTRAVENOUS
  Administered 2023-11-21 (×3): 80 ug via INTRAVENOUS

## 2023-11-21 MED ORDER — LACTATED RINGERS IV SOLN: INTRAVENOUS | Status: DC

## 2023-11-21 MED ORDER — OXYCODONE-ACETAMINOPHEN 5-325 MG PO TABS: 1.0000 | ORAL_TABLET | ORAL | Status: DC | PRN

## 2023-11-21 MED ORDER — CEFAZOLIN SODIUM-DEXTROSE 2-4 GM/100ML-% IV SOLN
2.0000 g | INTRAVENOUS | Status: AC
  Administered 2023-11-21: 2 g via INTRAVENOUS

## 2023-11-21 MED ORDER — ONDANSETRON HCL 4 MG/2ML IJ SOLN
INTRAMUSCULAR | Status: AC
  Filled 2023-11-21: qty 2

## 2023-11-21 MED ORDER — DOCUSATE SODIUM 100 MG PO CAPS: 100.0000 mg | ORAL_CAPSULE | Freq: Every day | ORAL | Status: DC

## 2023-11-21 MED ORDER — OXYCODONE HCL 5 MG PO TABS: 5.0000 mg | ORAL_TABLET | Freq: Once | ORAL | Status: DC | PRN

## 2023-11-21 MED ORDER — ORAL CARE MOUTH RINSE: 15.0000 mL | Freq: Once | OROMUCOSAL | Status: DC

## 2023-11-21 MED ORDER — CHLORHEXIDINE GLUCONATE 0.12 % MT SOLN
15.0000 mL | Freq: Once | OROMUCOSAL | Status: DC
  Filled 2023-11-21: qty 15

## 2023-11-21 MED ORDER — LIDOCAINE HCL (CARDIAC) PF 100 MG/5ML IV SOSY
PREFILLED_SYRINGE | INTRAVENOUS | Status: DC | PRN
  Administered 2023-11-21: 60 mg via INTRAVENOUS

## 2023-11-21 MED ORDER — CEFAZOLIN SODIUM-DEXTROSE 2-4 GM/100ML-% IV SOLN
2.0000 g | Freq: Three times a day (TID) | INTRAVENOUS | Status: AC
  Administered 2023-11-21 – 2023-11-22 (×2): 2 g via INTRAVENOUS
  Filled 2023-11-21 (×2): qty 100

## 2023-11-21 MED ORDER — OXYCODONE HCL 5 MG/5ML PO SOLN
5.0000 mg | Freq: Once | ORAL | Status: DC | PRN
  Filled 2023-11-21: qty 5

## 2023-11-21 MED ORDER — FENTANYL CITRATE (PF) 100 MCG/2ML IJ SOLN
INTRAMUSCULAR | Status: DC | PRN
  Administered 2023-11-21 (×2): 50 ug via INTRAVENOUS

## 2023-11-21 MED ORDER — HYDROMORPHONE HCL 1 MG/ML IJ SOLN: 1.0000 mg | Freq: Once | INTRAMUSCULAR | Status: DC | PRN

## 2023-11-21 MED ORDER — SUGAMMADEX SODIUM 200 MG/2ML IV SOLN
INTRAVENOUS | Status: DC | PRN
  Administered 2023-11-21: 200 mg via INTRAVENOUS

## 2023-11-21 MED ORDER — EPHEDRINE SULFATE-NACL 50-0.9 MG/10ML-% IV SOSY
PREFILLED_SYRINGE | INTRAVENOUS | Status: DC | PRN
  Administered 2023-11-21 (×2): 5 mg via INTRAVENOUS

## 2023-11-21 MED ORDER — METOPROLOL TARTRATE 5 MG/5ML IV SOLN: 2.0000 mg | INTRAVENOUS | Status: DC | PRN

## 2023-11-21 MED ORDER — AMLODIPINE BESYLATE 5 MG PO TABS
5.0000 mg | ORAL_TABLET | Freq: Every day | ORAL | Status: DC
  Administered 2023-11-22: 5 mg via ORAL
  Filled 2023-11-21: qty 1

## 2023-11-21 MED ORDER — ONDANSETRON HCL 4 MG/2ML IJ SOLN
INTRAMUSCULAR | Status: DC | PRN
  Administered 2023-11-21: 4 mg via INTRAVENOUS

## 2023-11-21 MED ORDER — ACETAMINOPHEN 650 MG RE SUPP: 325.0000 mg | RECTAL | Status: DC | PRN

## 2023-11-21 MED ORDER — ALUM & MAG HYDROXIDE-SIMETH 200-200-20 MG/5ML PO SUSP: 15.0000 mL | ORAL | Status: DC | PRN

## 2023-11-21 MED ORDER — SODIUM CHLORIDE 0.9 % IV SOLN: INTRAVENOUS | Status: DC

## 2023-11-21 SURGICAL SUPPLY — 38 items
BRUSH SCRUB EZ 4% CHG (MISCELLANEOUS) IMPLANT
CATH ACCU-VU SIZ PIG 5F 70CM (CATHETERS) IMPLANT
CATH BALLN CODA 9X100X32 (BALLOONS) IMPLANT
CATH BEACON 5 .035 65 KMP TIP (CATHETERS) IMPLANT
CLOSURE PERCLOSE PROSTYLE (VASCULAR PRODUCTS) IMPLANT
COVER DRAPE FLUORO 36X44 (DRAPES) IMPLANT
COVER PROBE ULTRASOUND 5X96 (MISCELLANEOUS) IMPLANT
DEVICE SAFEGUARD 24CM (GAUZE/BANDAGES/DRESSINGS) IMPLANT
DEVICE TORQUE (MISCELLANEOUS) IMPLANT
ELECT REM PT RETURN 9FT ADLT (ELECTROSURGICAL) ×1 IMPLANT
ELECTRODE REM PT RTRN 9FT ADLT (ELECTROSURGICAL) IMPLANT
EXCLDR TRNK ENDO 26X14.5X12 16 (Endovascular Graft) ×1 IMPLANT
EXCLUDER TNK END 26X14.5X12 16 (Endovascular Graft) IMPLANT
GLIDEWIRE STIFF .35X180X3 HYDR (WIRE) IMPLANT
GLOVE BIO SURGEON STRL SZ7 (GLOVE) IMPLANT
GLOVE SURG SYN 8.0 (GLOVE) ×1 IMPLANT
GLOVE SURG SYN 8.0 PF PI (GLOVE) IMPLANT
GOWN STRL REUS W/ TWL XL LVL3 (GOWN DISPOSABLE) IMPLANT
GOWN STRL REUS W/TWL 2XL LVL3 (GOWN DISPOSABLE) IMPLANT
LEG CONTRALATERAL 16X12X12 (Vascular Products) IMPLANT
NDL ENTRY 21GA 7CM ECHOTIP (NEEDLE) IMPLANT
NEEDLE ENTRY 21GA 7CM ECHOTIP (NEEDLE) ×1 IMPLANT
PACK ANGIOGRAPHY (CUSTOM PROCEDURE TRAY) ×1 IMPLANT
PACK BASIN MAJOR ARMC (MISCELLANEOUS) IMPLANT
SET INTRO CAPELLA COAXIAL (SET/KITS/TRAYS/PACK) IMPLANT
SHEATH BRITE TIP 6FRX11 (SHEATH) IMPLANT
SHEATH BRITE TIP 8FRX11 (SHEATH) IMPLANT
SHEATH DRYSEAL FLEX 12FR 33CM (SHEATH) IMPLANT
SHEATH DRYSEAL FLEX 16FR 33CM (SHEATH) IMPLANT
SPONGE XRAY 4X4 16PLY STRL (MISCELLANEOUS) IMPLANT
STENT GRAFT CONTRALAT 16X12X10 (Vascular Products) IMPLANT
SUT MNCRL 4-0 27 PS-2 XMFL (SUTURE) ×2 IMPLANT
SUTURE MNCRL 4-0 27XMF (SUTURE) IMPLANT
SYR MEDRAD MARK 7 150ML (SYRINGE) IMPLANT
TRAY FOLEY SLVR 16FR LF STAT (SET/KITS/TRAYS/PACK) IMPLANT
TUBING CONTRAST HIGH PRESS 72 (TUBING) IMPLANT
WIRE AMPLATZ SSTIFF .035X260CM (WIRE) IMPLANT
WIRE J 3MM .035X145CM (WIRE) IMPLANT

## 2023-11-21 NOTE — Transfer of Care (Signed)
 Immediate Anesthesia Transfer of Care Note  Patient: Gregory Colon  Procedure(s) Performed: ENDOVASCULAR REPAIR/STENT GRAFT  Patient Location: PACU  Anesthesia Type:General  Level of Consciousness: awake and alert   Airway & Oxygen Therapy: Patient Spontanous Breathing and Patient connected to nasal cannula oxygen  Post-op Assessment: Report given to RN and Post -op Vital signs reviewed and stable  Post vital signs: Reviewed and stable  Last Vitals:  Vitals Value Taken Time  BP 134/82 11/21/23 1411  Temp    Pulse 68 11/21/23 1414  Resp 15 11/21/23 1414  SpO2 100 % 11/21/23 1414  Vitals shown include unfiled device data.  Last Pain:  Vitals:   11/21/23 1214  TempSrc: Oral         Complications: No notable events documented.

## 2023-11-21 NOTE — Anesthesia Preprocedure Evaluation (Addendum)
 Anesthesia Evaluation  Patient identified by MRN, date of birth, ID band Patient awake    Reviewed: Allergy & Precautions, NPO status , Patient's Chart, lab work & pertinent test results  History of Anesthesia Complications Negative for: history of anesthetic complications  Airway Mallampati: III  TM Distance: >3 FB Neck ROM: full    Dental  (+) Upper Dentures, Lower Dentures   Pulmonary COPD, former smoker   Pulmonary exam normal        Cardiovascular hypertension, On Medications + CAD and + Peripheral Vascular Disease  Normal cardiovascular exam  Echo 2/25 NORMAL LEFT VENTRICULAR SYSTOLIC FUNCTION WITH MILD LVH  ESTIMATED EF: >55%  NORMAL LA PRESSURES WITH DIASTOLIC DYSFUNCTION (GRADE 1)  NORMAL RIGHT VENTRICULAR SYSTOLIC FUNCTION  VALVULAR REGURGITATION: No AR, TRIVIAL MR, TRIVIAL PR, MILD TR  NO VALVULAR STENOSIS     Neuro/Psych negative neurological ROS  negative psych ROS   GI/Hepatic negative GI ROS, Neg liver ROS,,,  Endo/Other  negative endocrine ROS    Renal/GU negative Renal ROS  negative genitourinary   Musculoskeletal   Abdominal   Peds  Hematology negative hematology ROS (+)   Anesthesia Other Findings Past Medical History: 11/2014: AAA (abdominal aortic aneurysm) (HCC)     Comment:  by MRI 3cm No date: Arthritis     Comment:  R hand No date: Benign essential hypertension 10/2014: Coronary artery calcification seen on CT scan 10/2014: Emphysema of lung (HCC)     Comment:  by CT 11/2014: Fatty liver     Comment:  by [place] No date: History of chicken pox 1966: History of kidney stones No date: Hyperlipidemia No date: Hypertension 11/2014: Kidney cysts     Comment:  R>L No date: Liver cyst     Comment:  multiple No date: Smoker  Past Surgical History: No date: ANKLE SURGERY; Bilateral     Comment:  L then R - fell off lader on separate occasions Hyacinth Meeker)              remotely 2000:  CHOLECYSTECTOMY 01/08/2015: COLONOSCOPY; N/A     Comment:  mult TAs Scot Jun, MD) 05/11/2016: COLONOSCOPY; N/A     Comment:  TAs and SSA, rpt 3 yrs (Dr Scot Jun) 06/16/2019: COLONOSCOPY WITH PROPOFOL; N/A     Comment:  TA x4, no f/u recommended Tobi Bastos, Sharlet Salina, MD) 01/08/2015: ESOPHAGOGASTRODUODENOSCOPY; N/A     Comment:  gastritis, HH Scot Jun, MD)     Reproductive/Obstetrics negative OB ROS                             Anesthesia Physical Anesthesia Plan  ASA: 3  Anesthesia Plan: General ETT   Post-op Pain Management: Ofirmev IV (intra-op)* and Toradol IV (intra-op)*   Induction: Intravenous  PONV Risk Score and Plan: 2 and Ondansetron, Dexamethasone and Treatment may vary due to age or medical condition  Airway Management Planned: Oral ETT  Additional Equipment:   Intra-op Plan:   Post-operative Plan: Extubation in OR  Informed Consent: I have reviewed the patients History and Physical, chart, labs and discussed the procedure including the risks, benefits and alternatives for the proposed anesthesia with the patient or authorized representative who has indicated his/her understanding and acceptance.     Dental Advisory Given  Plan Discussed with: Anesthesiologist, CRNA and Surgeon  Anesthesia Plan Comments: (Patient consented for risks of anesthesia including but not limited to:  - adverse reactions to medications - damage  to eyes, teeth, lips or other oral mucosa - nerve damage due to positioning  - sore throat or hoarseness - Damage to heart, brain, nerves, lungs, other parts of body or loss of life  Patient voiced understanding and assent.)        Anesthesia Quick Evaluation

## 2023-11-21 NOTE — H&P (Signed)
 [facility] Admission History & Physical  MRN : [id number]  Gregory Colon is a 78 y.o. ([birth year]) male who presents with chief complaint of No chief complaint on file. Marland Kitchen  History of Present Illness: Patient presents for abdominal aortic aneurysm repair.  Seen in clinic about 6 or 7 weeks ago and now has gotten all his preoperative clearances.  No new aneurysm related symptoms.  Current Facility-Administered Medications  Medication Dose Route Frequency Provider Last Rate Last Admin   ceFAZolin (ANCEF) IVPB 2g/100 mL premix  2 g Intravenous On Call to OR Georgiana Spinner, NP       chlorhexidine (PERIDEX) 0.12 % solution 15 mL  15 mL Mouth/Throat Once Foye Deer, MD       Or   Oral care mouth rinse  15 mL Mouth Rinse Once Foye Deer, MD       Chlorhexidine Gluconate Cloth 2 % PADS 6 each  6 each Topical Once Georgiana Spinner, NP       And   Chlorhexidine Gluconate Cloth 2 % PADS 6 each  6 each Topical Once Georgiana Spinner, NP       HYDROmorphone (DILAUDID) injection 1 mg  1 mg Intravenous Once PRN Georgiana Spinner, NP       lactated ringers infusion   Intravenous Continuous Foye Deer, MD       ondansetron [facility]) injection 4 mg  4 mg Intravenous Q6H PRN Georgiana Spinner, NP        Past Medical History:  Diagnosis Date   AAA (abdominal aortic aneurysm) (HCC) 11/2014   by MRI 3cm   Arthritis    R hand   Benign essential hypertension    Coronary artery calcification seen on CT scan 10/2014   Emphysema of lung (HCC) 10/2014   by CT   Fatty liver 11/2014   by [place]   History of chicken pox    History of kidney stones 1966   Hyperlipidemia    Hypertension    Kidney cysts 11/2014   R>L   Liver cyst    multiple   Smoker     Past Surgical History:  Procedure Laterality Date   ANKLE SURGERY Bilateral    L then R - fell off lader on separate occasions Hyacinth Meeker) remotely   CHOLECYSTECTOMY  2000   COLONOSCOPY N/A  01/08/2015   mult TAs Scot Jun, MD)   COLONOSCOPY N/A 05/11/2016   TAs and SSA, rpt 3 yrs (Dr Scot Jun)   COLONOSCOPY WITH PROPOFOL N/A 06/16/2019   TA x4, no f/u recommended Tobi Bastos, Sharlet Salina, MD)   ESOPHAGOGASTRODUODENOSCOPY N/A 01/08/2015   gastritis, HH Scot Jun, MD)     Social History   Tobacco Use   Smoking status: Former    Current packs/day: 0.00    Average packs/day: 0.3 packs/day for 52.3 years (13.1 ttl pk-yrs)    Types: Cigarettes    Start date: 08/28/1964    Quit date: 12/26/2016    Years since quitting: 6.9   Smokeless tobacco: Never   Tobacco comments:    30+ PY  Substance Use Topics   Alcohol use: No    Alcohol/week: 0.0 standard drinks of alcohol   Drug use: No     Family History  Problem Relation Age of Onset   Arthritis Mother    Cancer Brother        lung   Hypertension Neg Hx    CAD Neg  Hx    Stroke Neg Hx    Diabetes Neg Hx     No Known Allergies   REVIEW OF SYSTEMS (Negative unless checked)   Constitutional: [] Weight loss  [] Fever  [] Chills Cardiac: [] Chest pain   [] Chest pressure   [] Palpitations   [] Shortness of breath when laying flat   [] Shortness of breath at rest   [] Shortness of breath with exertion. Vascular:  [] Pain in legs with walking   [] Pain in legs at rest   [] Pain in legs when laying flat   [] Claudication   [] Pain in feet when walking  [] Pain in feet at rest  [] Pain in feet when laying flat   [] History of DVT   [] Phlebitis   [] Swelling in legs   [] Varicose veins   [] Non-healing ulcers Pulmonary:   [] Uses home oxygen   [] Productive cough   [] Hemoptysis   [] Wheeze  [x] COPD   [] Asthma Neurologic:  [] Dizziness  [] Blackouts   [] Seizures   [] History of stroke   [] History of TIA  [] Aphasia   [] Temporary blindness   [] Dysphagia   [] Weakness or numbness in arms   [] Weakness or numbness in legs Musculoskeletal:  [x] Arthritis   [] Joint swelling   [] Joint pain   [] Low back pain Hematologic:  [] Easy bruising  [] Easy  bleeding   [] Hypercoagulable state   [] Anemic  [] Hepatitis Gastrointestinal:  [] Blood in stool   [] Vomiting blood  [x] Gastroesophageal reflux/heartburn   [] Abdominal pain Genitourinary:  [] Chronic kidney disease   [] Difficult urination  [] Frequent urination  [] Burning with urination   [] Hematuria Skin:  [] Rashes   [] Ulcers   [] Wounds Psychological:  [] History of anxiety   []  History of major depression.  Physical Examination  There were no vitals filed for this visit. There is no height or weight on file to calculate BMI. Gen: WD/WN, NAD Head: [place]/AT, No temporalis wasting.  Ear/Nose/Throat: Hearing grossly intact, nares w/o erythema or drainage, oropharynx w/o Erythema/Exudate,  Eyes: Conjunctiva clear, sclera non-icteric Neck: Trachea midline.  No JVD.  Pulmonary:  Good air movement, respirations not labored, no use of accessory muscles.  Cardiac: RRR, normal S1, S2. Vascular:  Vessel Right Left  Radial Palpable Palpable               Musculoskeletal: M/S 5/5 throughout.  Extremities without ischemic changes.  No deformity or atrophy.  Neurologic: Sensation grossly intact in extremities.  Symmetrical.  Speech is fluent. Motor exam as listed above. Psychiatric: Judgment intact, Mood & affect appropriate for pt's clinical situation. Dermatologic: No rashes or ulcers noted.  No cellulitis or open wounds.      CBC Lab Results  Component Value Date   WBC 8.7 11/13/2023   HGB 15.2 11/13/2023   HCT 43.8 11/13/2023   MCV 92.0 11/13/2023   PLT 268 11/13/2023    BMET    Component Value Date/Time   NA 137 11/13/2023 0924   NA 136 08/25/2014 2004   K 4.2 11/13/2023 0924   K 4.3 08/25/2014 2004   CL 106 11/13/2023 0924   CL 105 08/25/2014 2004   CO2 26 11/13/2023 0924   CO2 25 08/25/2014 2004   GLUCOSE 102 (H) 11/13/2023 0924   GLUCOSE 99 08/25/2014 2004   BUN 16 11/13/2023 0924   BUN 17 08/25/2014 2004   CREATININE 1.08 11/13/2023 0924   CREATININE 0.98 08/25/2014 2004    CALCIUM 9.9 11/13/2023 0924   CALCIUM 9.1 08/25/2014 2004   GFRNONAA >60 11/13/2023 0924   GFRNONAA >60 08/25/2014 2004   GFRAA >  60 08/25/2014 2004   Estimated Creatinine Clearance: 59.9 mL/min (by C-G formula based on SCr of 1.08 mg/dL).  COAG No results found for: "INR", "PROTIME"  Radiology No results found.   Assessment/Plan AAA (abdominal aortic aneurysm) (HCC) I have independently reviewed his CT angiogram of the abdomen and pelvis.  Although the official report was of a 4.8 cm abdominal aortic aneurysm, this may be the maximal diameter in the AP direction but clearly in the transverse projection had this measured at 5.0 or 5.1 cm in multiple slices.  That would correlate with his duplex findings and he has had significant growth of his aneurysm up now to this 5 cm mark.  He does have a good infrarenal neck for stent placement and generally fairly favorable anatomy for an endovascular repair.  This also represents about 5 mm of growth in the last 6 months.  For these reasons with an aneurysm now at the 5 cm threshold with rapid growth, repair is indicated.  I have had a long discussion today with the patient and his daughter regarding endovascular abdominal aortic aneurysm repair.  We discussed how this differs from the traditional open surgical repair.  We discussed the risks and benefits of the procedure.  They voiced their understanding and desire to proceed with endovascular abdominal aortic aneurysm repair.  Primary hypertension blood pressure control important in reducing the progression of atherosclerotic disease and aneurysmal growth. On appropriate oral medications.     Dyslipidemia lipid control important in reducing the progression of atherosclerotic disease. Continue statin therapy   Festus Barren, MD  11/21/2023 11:29 AM

## 2023-11-21 NOTE — Op Note (Signed)
 OPERATIVE NOTE   PROCEDURE: US guidance for vascular access, bilateral femoral arteries Catheter placement into aorta from bilateral femoral approaches Placement of a conformable 26 x 14 x 12 C3 Gore Excluder Endoprosthesis main body with a 12 x 10 ipsilateral extender and with a 12 x 12 contralateral limb ProGlide closure devices bilateral femoral arteries  PRE-OPERATIVE DIAGNOSIS: AAA  POST-OPERATIVE DIAGNOSIS: same  SURGEON: Levora Dredge, MD and Festus Barren, MD - Co-surgeons  ANESTHESIA: general  ESTIMATED BLOOD LOSS: 50 cc  FINDING(S): 1.  AAA greater than 5.0 centimeters  SPECIMEN(S):  none  INDICATIONS:   Gregory Colon is a 78 y.o. y.o. male who presents with presents with an abdominal aortic aneurysm that is greater than 5 cm placing him at risk for lethal rupture.  The anatomy is suitable for endovascular repair.  Risks and benefits for repair of the abdominal aortic aneurysm using an endograft was described in detail to the patient all questions have been answered patient agrees to proceed.  Co-surgeons are utilized to expedite the procedure and reduce operative time improving patient's safety and improving outcome.  DESCRIPTION: After obtaining full informed written consent, the patient was brought back to the operating room and placed supine upon the operating table.  The patient received IV antibiotics prior to induction.  After obtaining adequate anesthesia, the patient was prepped and draped in the standard fashion for endovascular AAA repair.  Co-surgeons are required because this is a complex bilateral procedure with work being performed simultaneously from both the right femoral and left femoral approach.  This also expedites the procedure making a shorter operative time reducing complications and improving patient safety.  We then began by gaining access to both femoral arteries with US guidance with me working on the patient's left and Dr. Wyn Quaker working on the  patient's right.  The femoral arteries were found to be patent and accessed without difficulty with a needle under ultrasound guidance without difficulty on each side and permanent images were recorded.  We then placed 2 proglide devices on each side in a pre-close fashion and placed 8 French sheaths.  The patient was then given 6000 units of intravenous heparin.   The Pigtail catheter was placed into the aorta from the left side. Using this image, we selected a conformable 26 x 14 x 12 C3 Main body device.  Over a stiff wire, an 16 French sheath was placed. The main body was then placed through the 18 French sheath. A Kumpe catheter was placed up the left side and a magnified image at the renal arteries was performed. The main body was then deployed just below the lowest renal artery. The Kumpe catheter was used to cannulate the contralateral gate without difficulty and successful cannulation was confirmed by twirling the pigtail catheter in the main body. We then placed a stiff wire and a retrograde arteriogram was performed through the left femoral sheath. We upsized to the 12 [place] sheath for the contralateral limb and a 12 x 12 contralateral iliac extender limb was selected and deployed. The main body deployment was then completed. Based off the angiographic findings, extension limbs were necessary on the ipsilateral side.  Retrograde injection through the main sheath was then performed in an LAO projection and a 12 x 10 iliac extender limb was selected.  It was advanced up the right sided sheath overlapping the main body and deployed without difficulty. All junction points and seals zones were treated with the compliant balloon.   The pigtail  catheter was then replaced and a completion angiogram was performed.   A delayed type II endoleak was detected on completion angiography. The renal arteries were found to be widely patent.    At this point we elected to terminate the procedure. We secured the pro  glide devices for hemostasis on the femoral arteries. The skin incision was closed with a 4-0 Monocryl. Dermabond and pressure dressing were placed. The patient was taken to the recovery room in stable condition having tolerated the procedure well.  COMPLICATIONS: none  CONDITION: stable  Levora Dredge  11/21/2023, 2:08 PM

## 2023-11-21 NOTE — Op Note (Signed)
 OPERATIVE NOTE   PROCEDURE: US guidance for vascular access, bilateral femoral arteries Catheter placement into aorta from bilateral femoral approaches Placement of a 26 mm conformable Gore Excluder Endoprosthesis main body right with a 12 mm x 12 cm left iliac contralateral limb Placement of a 12 mm x 10 cm right iliac extension limb ProGlide closure devices bilateral femoral arteries  PRE-OPERATIVE DIAGNOSIS: AAA  POST-OPERATIVE DIAGNOSIS: same  SURGEON: Festus Barren, MD and Levora Dredge, MD - Co-surgeons  ANESTHESIA: general  ESTIMATED BLOOD LOSS: 50 cc  FINDING(S): 1.  AAA  SPECIMEN(S):  none  INDICATIONS:   Gregory Colon is a 78 y.o. male who presents with a greater than 5 cm abdominal aortic aneurysm with relatively rapid expansion over the past year of roughly a centimeter. The anatomy was suitable for endovascular repair.  Risks and benefits of repair in an endovascular fashion were discussed and informed consent was obtained. Co-surgeons are used to expedite the procedure and reduce operative time as bilateral work needs to be done.  DESCRIPTION: After obtaining full informed written consent, the patient was brought back to the operating room and placed supine upon the operating table.  The patient received IV antibiotics prior to induction.  After obtaining adequate anesthesia, the patient was prepped and draped in the standard fashion for endovascular AAA repair.  We then began by gaining access to both femoral arteries with US guidance with me working on the right and Dr. Gilda Crease working on the left.  The femoral arteries were found to be patent and accessed without difficulty with a needle under ultrasound guidance without difficulty on each side and permanent images were recorded.  We then placed 2 proglide devices on each side in a pre-close fashion and placed 8 French sheaths. The patient was then given 6000 units of intravenous heparin. The Pigtail catheter  was placed into the aorta from the left side. Using this image, we selected a 26 mm conformable Gore excluder Main body device.  Over a stiff wire, an 16 French sheath was placed up the right side. The main body was then placed through the 16 French sheath. A Kumpe catheter was placed up the left side and a magnified image at the renal arteries was performed. The main body was then deployed just below the lowest renal artery which was the right renal artery. The Kumpe catheter was used to cannulate the contralateral gate without difficulty and successful cannulation was confirmed by twirling the pigtail catheter in the main body. We then placed a stiff wire and a retrograde arteriogram was performed through the left femoral sheath. We upsized to the 12 [place] sheath for the contralateral limb and a 12 mm diameter by 12 cm length left iliac limb was selected and deployed. The main body deployment was then completed. Based off the angiographic findings, extension limbs were necessary.  A 12 mm diameter by 10 cm length right iliac extender was then placed. All junction points and seals zones were treated with the compliant balloon. The pigtail catheter was then replaced and a completion angiogram was performed.  A small late type II endoleak was detected on completion angiography. The renal arteries were found to be widely patent.  Both hypogastric arteries are found to be widely patent. At this point we elected to terminate the procedure. We secured the pro glide devices for hemostasis on the femoral arteries. The skin incision was closed with a 4-0 Monocryl. Dermabond and pressure dressing were placed. The patient was  taken to the recovery room in stable condition having tolerated the procedure well.  COMPLICATIONS: none  CONDITION: stable  Festus Barren  11/21/2023, 2:01 PM   This note was created with Dragon Medical transcription system. Any errors in dictation are purely unintentional.

## 2023-11-21 NOTE — Anesthesia Procedure Notes (Signed)
 Procedure Name: Intubation Date/Time: 11/21/2023 12:51 PM  Performed by: Monico Hoar, CRNAPre-anesthesia Checklist: Patient identified, Patient being monitored, Timeout performed, Emergency Drugs available and Suction available Patient Re-evaluated:Patient Re-evaluated prior to induction Oxygen Delivery Method: Circle system utilized Preoxygenation: Pre-oxygenation with 100% oxygen Induction Type: IV induction Ventilation: Mask ventilation without difficulty Laryngoscope Size: Glidescope and 4 Grade View: Grade I Tube type: Oral Tube size: 7.0 mm Number of attempts: 1 Airway Equipment and Method: Stylet Placement Confirmation: ETT inserted through vocal cords under direct vision, positive ETCO2 and breath sounds checked- equal and bilateral Secured at: 21 cm Tube secured with: Tape Dental Injury: Teeth and Oropharynx as per pre-operative assessment

## 2023-11-22 ENCOUNTER — Encounter: Payer: Self-pay | Admitting: Vascular Surgery

## 2023-11-22 DIAGNOSIS — Z95828 Presence of other vascular implants and grafts: Secondary | ICD-10-CM

## 2023-11-22 DIAGNOSIS — Z9889 Other specified postprocedural states: Secondary | ICD-10-CM

## 2023-11-22 LAB — BASIC METABOLIC PANEL WITH GFR
Anion gap: 8 (ref 5–15)
BUN: 15 mg/dL (ref 8–23)
CO2: 23 mmol/L (ref 22–32)
Calcium: 8.6 mg/dL — ABNORMAL LOW (ref 8.9–10.3)
Chloride: 106 mmol/L (ref 98–111)
Creatinine, Ser: 1.21 mg/dL (ref 0.61–1.24)
GFR, Estimated: >60 mL/min (ref >60)
Glucose, Bld: 133 mg/dL — ABNORMAL HIGH (ref 70–99)
Potassium: 4.3 mmol/L (ref 3.5–5.1)
Sodium: 137 mmol/L (ref 135–145)

## 2023-11-22 LAB — CBC
HCT: 39.1 % (ref 39.0–52.0)
Hemoglobin: 13.3 g/dL (ref 13.0–17.0)
MCH: 31.7 pg (ref 26.0–34.0)
MCHC: 34 g/dL (ref 30.0–36.0)
MCV: 93.1 fL (ref 80.0–100.0)
Platelets: 244 10*3/uL (ref 150–400)
RBC: 4.2 MIL/uL — ABNORMAL LOW (ref 4.22–5.81)
RDW: 13.6 % (ref 11.5–15.5)
WBC: 16.2 10*3/uL — ABNORMAL HIGH (ref 4.0–10.5)
nRBC: 0 % (ref 0.0–0.2)

## 2023-11-22 MED ORDER — ASPIRIN 81 MG PO TBEC: 81.0000 mg | DELAYED_RELEASE_TABLET | Freq: Every day | ORAL | 12 refills | Status: AC

## 2023-11-22 MED ORDER — CLOPIDOGREL BISULFATE 75 MG PO TABS: 75.0000 mg | ORAL_TABLET | Freq: Every day | ORAL | 11 refills | Status: AC

## 2023-11-22 NOTE — Progress Notes (Signed)
 PADs removed. Gauze and transparent dressings applied. No bleeding or hematoma noted

## 2023-11-22 NOTE — Anesthesia Postprocedure Evaluation (Signed)
 Anesthesia Post Note  Patient: Gregory Colon  Procedure(s) Performed: ENDOVASCULAR REPAIR/STENT GRAFT  Patient location during evaluation: PACU Anesthesia Type: General Level of consciousness: awake and alert Pain management: pain level controlled Vital Signs Assessment: post-procedure vital signs reviewed and stable Respiratory status: spontaneous breathing, nonlabored ventilation, respiratory function stable and patient connected to nasal cannula oxygen Cardiovascular status: blood pressure returned to baseline and stable Postop Assessment: no apparent nausea or vomiting Anesthetic complications: no   No notable events documented.   Last Vitals:  Vitals:   11/22/23 0600 11/22/23 0700  BP: 129/75 122/69  Pulse: 81 74  Resp: (!) 22 (!) 21  Temp:  36.8 C  SpO2: 95% 95%    Last Pain:  Vitals:   11/22/23 0700  TempSrc:   PainSc: 0-No pain                 Louie Boston

## 2023-11-22 NOTE — Progress Notes (Signed)
 Patient ambulated around unit with  no complications. Patient is on room air, oxygenating mid 90's. No complaints of pain. Sitting up in chair. Bilateral groins intact with no bleeding on hematoma, pedals pulses plus 1. Patient tolerating food and drinking. Voiding in urinal. Per Dr. Wyn Quaker patient will be discharged around lunch time. Prescriptions will be sent to pharmacy. Continue to assess.

## 2023-11-22 NOTE — Plan of Care (Signed)
  Problem: Education: Goal: Knowledge of discharge needs will improve Outcome: Progressing   Problem: Respiratory: Goal: Will achieve and/or maintain a regular respiratory rate, without signs or symptoms of dyspnea Outcome: Progressing   Problem: Clinical Measurements: Goal: Ability to maintain clinical measurements within normal limits will improve 11/22/2023 0622 by Arrie Eastern, RN Outcome: Progressing 11/22/2023 0622 by Arrie Eastern, RN Outcome: Progressing   Problem: Nutrition: Goal: Adequate nutrition will be maintained Outcome: Progressing   Problem: Coping: Goal: Level of anxiety will decrease Outcome: Progressing   Problem: Elimination: Goal: Will not experience complications related to bowel motility Outcome: Progressing

## 2023-11-22 NOTE — Discharge Summary (Signed)
 [facility]    Discharge Summary    Patient ID:  Gregory Colon MRN: [id number] DOB/AGE: 1945-09-23 78 y.o.  Admit date: 11/21/2023 Discharge date: 11/22/2023 Date of Surgery: 11/21/2023 Surgeon: Surgeon(s): Dew, Marlow Baars, MD Schnier, Latina Craver, MD  Admission Diagnosis: Abdominal aortic aneurysm (AAA) without rupture, unspecified part (HCC) [I71.40] AAA (abdominal aortic aneurysm, ruptured) (HCC) [I71.30]  Discharge Diagnoses:  Abdominal aortic aneurysm (AAA) without rupture, unspecified part (HCC) [I71.40] AAA (abdominal aortic aneurysm, ruptured) (HCC) [I71.30]  Secondary Diagnoses: Past Medical History:  Diagnosis Date   AAA (abdominal aortic aneurysm) (HCC) 11/2014   by MRI 3cm   Arthritis    R hand   Benign essential hypertension    Coronary artery calcification seen on CT scan 10/2014   Emphysema of lung (HCC) 10/2014   by CT   Fatty liver 11/2014   by [place]   History of chicken pox    History of kidney stones 1966   Hyperlipidemia    Hypertension    Kidney cysts 11/2014   R>L   Liver cyst    multiple   Smoker     Procedure(s): ENDOVASCULAR REPAIR/STENT GRAFT  Discharged Condition: good  HPI:  Gregory Colon is a 78 year old male who presents to [facility] for endovascular repair of an abdominal aortic aneurysm that was greater than 5 cm.  Now postop day 1 and recovering as expected.  No complaints overnight and vitals all remained stable.  Hospital Course:  Gregory Colon is a 78 y.o. male is S/P day #1 from Endovascular AAA repair.  Patient is resting comfortably in bedside chair this morning.  He has no complaints overnight.  Endorses that both of his groins are little sore but otherwise no pain.  Denies any pain to his lower extremities either sitting standing or ambulating.  Patient is eating well and voiding well.  Patient to be discharged home today.  Patient recovering as expected.  Patient  will be discharged on aspirin 81 mg daily, Plavix 75 mg daily and Lipitor 40 mg daily.  He was counseled on not missing or skipping any of these medications as it will interfere with the outcome of his procedure.  He verbalizes understanding today.   Extubated: POD # 0 Physical Exam:  Alert notes x3, no acute distress Face: Symmetrical.  Tongue is midline. Neck: Trachea is midline.  No swelling or bruising. Cardiovascular: Regular rate and rhythm Pulmonary: Clear to auscultation bilaterally Abdomen: Soft, nontender, nondistended Right groin access: Clean dry and intact.  No swelling or drainage noted Left groin access: Clean dry and intact.  No swelling or drainage noted Left lower extremity: Thigh soft.  Calf soft.  Extremities warm distally toes.  Hard to palpate pedal pulses however the foot is warm is her good capillary refill. Right lower extremity: Thigh soft.  Calf soft.  Extremities warm distally toes.  Hard to palpate pedal pulses however the foot is warm is her good capillary refill. Neurological: No deficits noted   Post-op wounds:  clean, dry, intact or healing well  Pt. Ambulating, voiding and taking PO diet without difficulty. Pt pain controlled with PO pain meds.  Labs:  As below  Complications: none  Consults:    Significant Diagnostic Studies: CBC Lab Results  Component Value Date   WBC 16.2 (H) 11/22/2023   HGB 13.3 11/22/2023   HCT 39.1 11/22/2023   MCV 93.1 11/22/2023   PLT 244 11/22/2023    BMET  Component Value Date/Time   NA 137 11/22/2023 0528   NA 136 08/25/2014 2004   K 4.3 11/22/2023 0528   K 4.3 08/25/2014 2004   CL 106 11/22/2023 0528   CL 105 08/25/2014 2004   CO2 23 11/22/2023 0528   CO2 25 08/25/2014 2004   GLUCOSE 133 (H) 11/22/2023 0528   GLUCOSE 99 08/25/2014 2004   BUN 15 11/22/2023 0528   BUN 17 08/25/2014 2004   CREATININE 1.21 11/22/2023 0528   CREATININE 0.98 08/25/2014 2004   CALCIUM 8.6 (L) 11/22/2023 0528    CALCIUM 9.1 08/25/2014 2004   GFRNONAA >60 11/22/2023 0528   GFRNONAA >60 08/25/2014 2004   GFRAA >60 08/25/2014 2004   COAG Lab Results  Component Value Date   INR 1.2 11/21/2023     Disposition:  Discharge to :Home  Allergies as of 11/22/2023   No Known Allergies      Medication List     TAKE these medications    Allergy Relief 25 MG tablet Generic drug: diphenhydrAMINE Take 25 mg by mouth daily. In am   amLODipine 5 MG tablet Commonly known as: NORVASC Take 1 tablet (5 mg total) by mouth daily.   aspirin EC 81 MG tablet Take 1 tablet (81 mg total) by mouth daily at 6 (six) AM. Swallow whole. Start taking on: November 23, 2023   atorvastatin 40 MG tablet Commonly known as: LIPITOR Take 1 tablet (40 mg total) by mouth daily.   clopidogrel 75 MG tablet Commonly known as: PLAVIX Take 1 tablet (75 mg total) by mouth daily at 6 (six) AM. Start taking on: November 23, 2023   lisinopril 10 MG tablet Commonly known as: ZESTRIL Take 1 tablet (10 mg total) by mouth daily.   meloxicam 15 MG tablet Commonly known as: MOBIC Take 15 mg by mouth. Daily with food   MULTIVITAMIN ADULTS 50+ PO Take 1 tablet by mouth daily.       Verbal and written Discharge instructions given to the patient. Wound care per Discharge AVS  Follow-up Information     Schnier, Latina Craver, MD Follow up in 4 week(s).   Specialties: Vascular Surgery, Cardiology, Radiology, Vascular Surgery Why: ENDART Contact information: [address] [place] [address] [phone]                 Signed: Marcie Bal, NP  11/22/2023, 9:41 AM

## 2023-11-22 NOTE — Discharge Instructions (Signed)
 Do not lift anything heavy.  Do not lift anything more than a gallon of milk for the next week.  No driving for 1 week.  You may shower tomorrow on Friday, 11/23/2023 after getting home.  Shower with your dressings to both groins in place.  If they come off of the shower that is fine if not remove them as soon as you get out of the shower.  Pat completely dry both groins in place Band-Aids over the insertion sites for the next 3 days.  You may resume all your oral blood pressure medications tomorrow on Friday 11/23/2023.  You are being discharged on aspirin 81 mg daily, Plavix 75 mg daily and Lipitor 40 mg daily.  Please do not miss or skip any of these medications as this will alter the outcome of your surgical procedure.  Follow-up with vein and vascular surgery as scheduled.

## 2023-12-18 ENCOUNTER — Encounter (INDEPENDENT_AMBULATORY_CARE_PROVIDER_SITE_OTHER): Payer: Self-pay | Admitting: Vascular Surgery

## 2023-12-18 ENCOUNTER — Ambulatory Visit (INDEPENDENT_AMBULATORY_CARE_PROVIDER_SITE_OTHER): Admitting: Vascular Surgery

## 2023-12-18 VITALS — BP 146/80 | HR 76 | Resp 18 | Ht 67.0 in | Wt 190.0 lb

## 2023-12-18 DIAGNOSIS — I7143 Infrarenal abdominal aortic aneurysm, without rupture: Secondary | ICD-10-CM

## 2023-12-18 DIAGNOSIS — I10 Essential (primary) hypertension: Secondary | ICD-10-CM

## 2023-12-18 DIAGNOSIS — E785 Hyperlipidemia, unspecified: Secondary | ICD-10-CM

## 2023-12-18 NOTE — Assessment & Plan Note (Signed)
blood pressure control important in reducing the progression of atherosclerotic disease and aneurysmal growth. On appropriate oral medications.  

## 2023-12-18 NOTE — Progress Notes (Signed)
 Patient ID: Gregory Colon, male   DOB: 11-Feb-1946, 78 y.o.   MRN: [id number]  Chief Complaint  Patient presents with   Follow-up    4 weeks follow up     HPI Gregory Colon is a 78 y.o. male.  Patient returns about a month after abdominal aortic aneurysm repair with a stent graft.  He is doing well.  He was discharged on postoperative day 1 without complications.  His incisions are well-healed.  He had about a 7 to 10-day recovery before he was back to normal with normal activities and driving.  No aneurysm related symptoms. Specifically, the patient denies new back or abdominal pain, or signs of peripheral embolization    Past Medical History:  Diagnosis Date   AAA (abdominal aortic aneurysm) (HCC) 11/2014   by MRI 3cm   Arthritis    R hand   Benign essential hypertension    Coronary artery calcification seen on CT scan 10/2014   Emphysema of lung (HCC) 10/2014   by CT   Fatty liver 11/2014   by US    History of chicken pox    History of kidney stones 1966   Hyperlipidemia    Hypertension    Kidney cysts 11/2014   R>L   Liver cyst    multiple   Smoker     Past Surgical History:  Procedure Laterality Date   ANKLE SURGERY Bilateral    L then R - fell off lader on separate occasions Annabell Key) remotely   CHOLECYSTECTOMY  2000   COLONOSCOPY N/A 01/08/2015   mult TAs Cassie Click, MD)   COLONOSCOPY N/A 05/11/2016   TAs and SSA, rpt 3 yrs (Dr Cassie Click)   COLONOSCOPY WITH PROPOFOL  N/A 06/16/2019   TA x4, no f/u recommended Luke Salaam, MD)   ENDOVASCULAR REPAIR/STENT GRAFT N/A 11/21/2023   Procedure: ENDOVASCULAR REPAIR/STENT GRAFT;  Surgeon: Celso College, MD;  Location: [facility];  Service: Cardiovascular;  Laterality: N/A;   ESOPHAGOGASTRODUODENOSCOPY N/A 01/08/2015   gastritis, HH Cassie Click, MD)      No Known Allergies  Current Outpatient Medications  Medication Sig Dispense Refill   amLODipine  (NORVASC ) 5 MG tablet  Take 1 tablet (5 mg total) by mouth daily. 90 tablet 4   aspirin  EC 81 MG tablet Take 1 tablet (81 mg total) by mouth daily at 6 (six) AM. Swallow whole. 30 tablet 12   atorvastatin  (LIPITOR) 40 MG tablet Take 1 tablet (40 mg total) by mouth daily. 90 tablet 4   clopidogrel  (PLAVIX ) 75 MG tablet Take 1 tablet (75 mg total) by mouth daily at 6 (six) AM. 30 tablet 11   diphenhydrAMINE (ALLERGY RELIEF) 25 MG tablet Take 25 mg by mouth daily. In am     lisinopril  (ZESTRIL ) 10 MG tablet Take 1 tablet (10 mg total) by mouth daily. 90 tablet 3   meloxicam (MOBIC) 15 MG tablet Take 15 mg by mouth. Daily with food     Multiple Vitamins-Minerals (MULTIVITAMIN ADULTS 50+ PO) Take 1 tablet by mouth daily.     No current facility-administered medications for this visit.        Physical Exam BP (!) 146/80   Pulse 76   Resp 18   Ht 5\' 7"  (1.702 m)   Wt 190 lb (86.2 kg)   BMI 29.76 kg/m  Gen:  WD/WN, NAD Skin: incision C/D/I     Assessment/Plan:  AAA (abdominal aortic aneurysm) (HCC) Doing well 1  month after endovascular abdominal aortic aneurysm repair.  May resume all normal activities with no restrictions.  Follow-up in 2 to 3 months with duplex.  Continue Plavix  at least 90 days after the procedure.  Primary hypertension blood pressure control important in reducing the progression of atherosclerotic disease and aneurysmal growth. On appropriate oral medications.   Dyslipidemia lipid control important in reducing the progression of atherosclerotic disease. Continue statin therapy      Mikki Alexander 12/18/2023, 9:12 AM   This note was created with Dragon medical transcription system.  Any errors from dictation are unintentional.

## 2023-12-18 NOTE — Assessment & Plan Note (Signed)
 Doing well 1 month after endovascular abdominal aortic aneurysm repair.  May resume all normal activities with no restrictions.  Follow-up in 2 to 3 months with duplex.  Continue Plavix  at least 90 days after the procedure.

## 2023-12-18 NOTE — Assessment & Plan Note (Signed)
 lipid control important in reducing the progression of atherosclerotic disease. Continue statin therapy

## 2023-12-20 ENCOUNTER — Ambulatory Visit (INDEPENDENT_AMBULATORY_CARE_PROVIDER_SITE_OTHER): Admitting: Vascular Surgery

## 2024-01-15 ENCOUNTER — Encounter (INDEPENDENT_AMBULATORY_CARE_PROVIDER_SITE_OTHER): Payer: Self-pay

## 2024-02-19 ENCOUNTER — Ambulatory Visit (INDEPENDENT_AMBULATORY_CARE_PROVIDER_SITE_OTHER): Admitting: Vascular Surgery

## 2024-02-19 ENCOUNTER — Ambulatory Visit (INDEPENDENT_AMBULATORY_CARE_PROVIDER_SITE_OTHER)

## 2024-02-19 VITALS — BP 129/84 | HR 86 | Ht 67.0 in | Wt 195.0 lb

## 2024-02-19 DIAGNOSIS — I10 Essential (primary) hypertension: Secondary | ICD-10-CM

## 2024-02-19 DIAGNOSIS — I7143 Infrarenal abdominal aortic aneurysm, without rupture: Secondary | ICD-10-CM | POA: Diagnosis not present

## 2024-02-19 NOTE — Assessment & Plan Note (Signed)
 His duplex today demonstrates a 4.8 cm aneurysm sac which is slightly decreased from his previous study.  No evidence of endoleak is seen. He is doing quite well.  No changes in medical regimen.  Plan to recheck in 6 months with duplex.

## 2024-02-19 NOTE — Assessment & Plan Note (Signed)
blood pressure control important in reducing the progression of atherosclerotic disease and aneurysmal growth. On appropriate oral medications.  

## 2024-02-19 NOTE — Progress Notes (Signed)
 MRN : [id number]  Gregory Colon is a 78 y.o. ([birth year]) male who presents with chief complaint of  Chief Complaint  Patient presents with   Follow-up    2-3 month EVAR + see jd  .  History of Present Illness: Patient returns today in follow up of of his abdominal aortic aneurysm.  He is 3 months status post repair.  He is doing well.  He is back to his baseline with normal functioning.  He has no new complaints today.  His duplex today demonstrates a 4.8 cm aneurysm sac which is slightly decreased from his previous study.  No evidence of endoleak is seen.  Current Outpatient Medications  Medication Sig Dispense Refill   amLODipine  (NORVASC ) 5 MG tablet Take 1 tablet (5 mg total) by mouth daily. 90 tablet 4   aspirin  EC 81 MG tablet Take 1 tablet (81 mg total) by mouth daily at 6 (six) AM. Swallow whole. 30 tablet 12   atorvastatin  (LIPITOR) 40 MG tablet Take 1 tablet (40 mg total) by mouth daily. 90 tablet 4   clopidogrel  (PLAVIX ) 75 MG tablet Take 1 tablet (75 mg total) by mouth daily at 6 (six) AM. 30 tablet 11   diphenhydrAMINE (ALLERGY RELIEF) 25 MG tablet Take 25 mg by mouth daily. In am     lisinopril  (ZESTRIL ) 10 MG tablet Take 1 tablet (10 mg total) by mouth daily. 90 tablet 3   meloxicam (MOBIC) 15 MG tablet Take 15 mg by mouth. Daily with food     Multiple Vitamins-Minerals (MULTIVITAMIN ADULTS 50+ PO) Take 1 tablet by mouth daily.     No current facility-administered medications for this visit.    Past Medical History:  Diagnosis Date   AAA (abdominal aortic aneurysm) (HCC) 11/2014   by MRI 3cm   Arthritis    R hand   Benign essential hypertension    Coronary artery calcification seen on CT scan 10/2014   Emphysema of lung (HCC) 10/2014   by CT   Fatty liver 11/2014   by US    History of chicken pox    History of kidney stones 1966   Hyperlipidemia    Hypertension    Kidney cysts 11/2014   R>L   Liver cyst    multiple   Smoker     Past Surgical  History:  Procedure Laterality Date   ANKLE SURGERY Bilateral    L then R - fell off lader on separate occasions Ted) remotely   CHOLECYSTECTOMY  2000   COLONOSCOPY N/A 01/08/2015   mult TAs Beverlie ONEIDA Holmes, MD)   COLONOSCOPY N/A 05/11/2016   TAs and SSA, rpt 3 yrs (Dr Lamar ONEIDA Holmes)   COLONOSCOPY WITH PROPOFOL  N/A 06/16/2019   TA x4, no f/u recommended Romero Bi, MD)   ENDOVASCULAR REPAIR/STENT GRAFT N/A 11/21/2023   Procedure: ENDOVASCULAR REPAIR/STENT GRAFT;  Surgeon: Marea Selinda RAMAN, MD;  Location: [facility];  Service: Cardiovascular;  Laterality: N/A;   ESOPHAGOGASTRODUODENOSCOPY N/A 01/08/2015   gastritis, HH Beverlie ONEIDA Holmes, MD)     Social History   Tobacco Use   Smoking status: Former    Current packs/day: 0.00    Average packs/day: 0.3 packs/day for 52.3 years (13.1 ttl pk-yrs)    Types: Cigarettes    Start date: 08/28/1964    Quit date: 12/26/2016    Years since quitting: 7.1   Smokeless tobacco: Never   Tobacco comments:    30+ PY  Substance Use Topics  Alcohol use: No    Alcohol/week: 0.0 standard drinks of alcohol   Drug use: No       Family History  Problem Relation Age of Onset   Arthritis Mother    Cancer Brother        lung   Hypertension Neg Hx    CAD Neg Hx    Stroke Neg Hx    Diabetes Neg Hx      No Known Allergies    Physical Examination  BP 129/84   Pulse 86   Ht 5' 7 (1.702 m)   Wt 195 lb (88.5 kg)   BMI 30.54 kg/m  Gen:  WD/WN, NAD Head: [place]/AT, No temporalis wasting. Ear/Nose/Throat: Hearing grossly intact, nares w/o erythema or drainage Eyes: Conjunctiva clear. Sclera non-icteric Neck: Supple.  Trachea midline Pulmonary:  Good air movement, no use of accessory muscles.  Cardiac: RRR, no JVD Vascular:  Vessel Right Left  Radial Palpable Palpable           Musculoskeletal: M/S 5/5 throughout.  No deformity or atrophy.  Neurologic: Sensation grossly intact in extremities.  Symmetrical.  Speech is  fluent.  Psychiatric: Judgment intact, Mood & affect appropriate for pt's clinical situation. Dermatologic: No rashes or ulcers noted.  No cellulitis or open wounds.      Labs Recent Results (from the past 2160 hours)  Glucose, capillary     Status: Abnormal   Collection Time: 11/21/23  4:54 PM  Result Value Ref Range   Glucose-Capillary 130 (H) 70 - 99 mg/dL    Comment: Glucose reference range applies only to samples taken after fasting for at least 8 hours.  MRSA Next Gen by PCR, Nasal     Status: None   Collection Time: 11/21/23  5:00 PM   Specimen: Nasal Mucosa; Nasal Swab  Result Value Ref Range   MRSA by PCR Next Gen NOT DETECTED NOT DETECTED    Comment: (NOTE) The GeneXpert MRSA Assay (FDA approved for NASAL specimens only), is one component of a comprehensive MRSA colonization surveillance program. It is not intended to diagnose MRSA infection nor to guide or monitor treatment for MRSA infections. Test performance is not FDA approved in patients less than 49 years old. Performed at [facility], [address] [place], [place] [address]   CBC     Status: Abnormal   Collection Time: 11/22/23  5:28 AM  Result Value Ref Range   WBC 16.2 (H) 4.0 - 10.5 K/uL   RBC 4.20 (L) 4.22 - 5.81 MIL/uL   Hemoglobin 13.3 13.0 - 17.0 g/dL   HCT 60.8 60.9 - 47.9 %   MCV 93.1 80.0 - 100.0 fL   MCH 31.7 26.0 - 34.0 pg   MCHC 34.0 30.0 - 36.0 g/dL   RDW 86.3 88.4 - 84.4 %   Platelets 244 150 - 400 K/uL   nRBC 0.0 0.0 - 0.2 %    Comment: Performed at [facility], [address] [place], [place] [address]  Basic metabolic panel     Status: Abnormal   Collection Time: 11/22/23  5:28 AM  Result Value Ref Range   Sodium 137 135 - 145 mmol/L   Potassium 4.3 3.5 - 5.1 mmol/L   Chloride 106 98 - 111 mmol/L   CO2 23 22 - 32 mmol/L   Glucose, Bld 133 (H) 70 - 99 mg/dL    Comment: Glucose reference range applies only to samples taken after fasting for at least 8  hours.  BUN 15 8 - 23 mg/dL   Creatinine, Ser 8.78 0.61 - 1.24 mg/dL   Calcium  8.6 (L) 8.9 - 10.3 mg/dL   GFR, Estimated >39 >39 mL/min    Comment: (NOTE) Calculated using the CKD-EPI Creatinine Equation (2021)    Anion gap 8 5 - 15    Comment: Performed at [facility], [address], [place], [place] [address]    Radiology No results found.  Assessment/Plan  AAA (abdominal aortic aneurysm) (HCC) His duplex today demonstrates a 4.8 cm aneurysm sac which is slightly decreased from his previous study.  No evidence of endoleak is seen. He is doing quite well.  No changes in medical regimen.  Plan to recheck in 6 months with duplex.  Primary hypertension blood pressure control important in reducing the progression of atherosclerotic disease and aneurysmal growth. On appropriate oral medications.    Selinda Gu, MD  02/19/2024 3:06 PM    This note was created with Dragon medical transcription system.  Any errors from dictation are purely unintentional

## 2024-03-06 ENCOUNTER — Other Ambulatory Visit: Payer: Self-pay | Admitting: Family Medicine

## 2024-03-06 DIAGNOSIS — E785 Hyperlipidemia, unspecified: Secondary | ICD-10-CM

## 2024-03-06 DIAGNOSIS — R7303 Prediabetes: Secondary | ICD-10-CM

## 2024-03-07 ENCOUNTER — Other Ambulatory Visit (INDEPENDENT_AMBULATORY_CARE_PROVIDER_SITE_OTHER): Payer: 59

## 2024-03-07 DIAGNOSIS — E785 Hyperlipidemia, unspecified: Secondary | ICD-10-CM

## 2024-03-07 DIAGNOSIS — R7303 Prediabetes: Secondary | ICD-10-CM | POA: Diagnosis not present

## 2024-03-07 LAB — COMPREHENSIVE METABOLIC PANEL WITH GFR
ALT: 17 U/L (ref 0–53)
AST: 15 U/L (ref 0–37)
Albumin: 4.1 g/dL (ref 3.5–5.2)
Alkaline Phosphatase: 67 U/L (ref 39–117)
BUN: 14 mg/dL (ref 6–23)
CO2: 30 meq/L (ref 19–32)
Calcium: 9.4 mg/dL (ref 8.4–10.5)
Chloride: 105 meq/L (ref 96–112)
Creatinine, Ser: 1.08 mg/dL (ref 0.40–1.50)
GFR: 65.59 mL/min (ref >60.00)
Glucose, Bld: 100 mg/dL — ABNORMAL HIGH (ref 70–99)
Potassium: 4.4 meq/L (ref 3.5–5.1)
Sodium: 141 meq/L (ref 135–145)
Total Bilirubin: 0.6 mg/dL (ref 0.2–1.2)
Total Protein: 6.6 g/dL (ref 6.0–8.3)

## 2024-03-07 LAB — LIPID PANEL
Cholesterol: 139 mg/dL (ref 0–200)
HDL: 38.7 mg/dL — ABNORMAL LOW (ref >39.00)
LDL Cholesterol: 70 mg/dL (ref 0–99)
NonHDL: 100.49
Total CHOL/HDL Ratio: 4
Triglycerides: 151 mg/dL — ABNORMAL HIGH (ref 0.0–149.0)
VLDL: 30.2 mg/dL (ref 0.0–40.0)

## 2024-03-07 LAB — VITAMIN D 25 HYDROXY (VIT D DEFICIENCY, FRACTURES): VITD: 28.64 ng/mL — ABNORMAL LOW (ref 30.00–100.00)

## 2024-03-07 LAB — HEMOGLOBIN A1C: Hgb A1c MFr Bld: 6.1 % (ref 4.6–6.5)

## 2024-03-10 ENCOUNTER — Ambulatory Visit: Payer: Self-pay | Admitting: Family Medicine

## 2024-03-14 ENCOUNTER — Ambulatory Visit (INDEPENDENT_AMBULATORY_CARE_PROVIDER_SITE_OTHER): Payer: 59 | Admitting: Family Medicine

## 2024-03-14 ENCOUNTER — Encounter: Payer: Self-pay | Admitting: Family Medicine

## 2024-03-14 VITALS — BP 132/78 | HR 82 | Temp 97.7°F | Ht 67.0 in | Wt 196.0 lb

## 2024-03-14 DIAGNOSIS — I7143 Infrarenal abdominal aortic aneurysm, without rupture: Secondary | ICD-10-CM

## 2024-03-14 DIAGNOSIS — E559 Vitamin D deficiency, unspecified: Secondary | ICD-10-CM | POA: Insufficient documentation

## 2024-03-14 DIAGNOSIS — I10 Essential (primary) hypertension: Secondary | ICD-10-CM

## 2024-03-14 DIAGNOSIS — R011 Cardiac murmur, unspecified: Secondary | ICD-10-CM | POA: Diagnosis not present

## 2024-03-14 DIAGNOSIS — R7303 Prediabetes: Secondary | ICD-10-CM | POA: Diagnosis not present

## 2024-03-14 DIAGNOSIS — Z8679 Personal history of other diseases of the circulatory system: Secondary | ICD-10-CM | POA: Diagnosis not present

## 2024-03-14 DIAGNOSIS — E785 Hyperlipidemia, unspecified: Secondary | ICD-10-CM | POA: Diagnosis not present

## 2024-03-14 DIAGNOSIS — Z0000 Encounter for general adult medical examination without abnormal findings: Secondary | ICD-10-CM | POA: Diagnosis not present

## 2024-03-14 DIAGNOSIS — Z9889 Other specified postprocedural states: Secondary | ICD-10-CM | POA: Diagnosis not present

## 2024-03-14 MED ORDER — ATORVASTATIN CALCIUM 40 MG PO TABS: 40.0000 mg | ORAL_TABLET | Freq: Every day | ORAL | 4 refills | Status: AC

## 2024-03-14 MED ORDER — AMLODIPINE BESYLATE 5 MG PO TABS: 5.0000 mg | ORAL_TABLET | Freq: Every day | ORAL | 4 refills | Status: AC

## 2024-03-14 MED ORDER — LISINOPRIL 10 MG PO TABS: 10.0000 mg | ORAL_TABLET | Freq: Every day | ORAL | 4 refills | Status: AC

## 2024-03-14 NOTE — Assessment & Plan Note (Addendum)
Encouraged limiting added sugars in diet.  

## 2024-03-14 NOTE — Assessment & Plan Note (Signed)
 Note appreciated today

## 2024-03-14 NOTE — Progress Notes (Signed)
 Ph: [phone] Fax: [phone]   Patient ID: Gregory Colon, male    DOB: 1945-10-11, 78 y.o.   MRN: [id number]  This visit was conducted in person.  BP 132/78   Pulse 82   Temp 97.7 F (36.5 C) (Oral)   Ht 5' 7 (1.702 m)   Wt 196 lb (88.9 kg)   SpO2 99%   BMI 30.70 kg/m    CC: CPE Subjective:   HPI: Gregory Colon is a 78 y.o. male presenting on 03/14/2024 for Annual Exam   Saw health advisor 07/2023 for medicare wellness visit. Note reviewed.   No results found.  Flowsheet Row Office Visit from 03/14/2024 in [facility] [place]  PHQ-2 Total Score 1       03/14/2024    7:57 AM 08/15/2023    8:24 AM 08/17/2022    8:50 AM 08/09/2021    8:00 AM 03/25/2020    8:14 AM  Fall Risk   Falls in the past year? 0 0 0 0 0  Number falls in past yr:  0 0  0  Injury with Fall?  0 0  0  Risk for fall due to : Other (Comment) No Fall Risks No Fall Risks  Medication side effect  Follow up Falls evaluation completed Education provided;Falls prevention discussed Falls prevention discussed   Falls evaluation completed;Falls prevention discussed      Data saved with a previous flowsheet row definition    Since last seen, underwent AAA EVAR 10/2023 by Dr Marea at [facility]. Now on aspirin  and plavix  since AAA repair.  Did see [facility] cardiology Dr Custovic for preop clearance - with normal echo.   L [facility] OA (seronegative arthritis vs crystalline arthropathy) followed by Dr Tobie. He has had steroid injections into left wrist. Saw ortho Dr Cleotilde, declined joint replacement surgery, managing with daily oral NSAID mobic 15mg . Has not returned to see then.    Preventative: COLONOSCOPY WITH PROPOFOL  06/16/2019 - TA x4, no f/u recommended Romero, Ruel, MD)  Prostate cancer screening - no fmhx. Declines DRE.  Lung cancer screening - done 10/2014. Declined rpt screening.  [facility]  Moderna vaccine - 11/2019, 12/2019, booster x1  Prevnar-13  07/2014, pneumovax 02/2016  Shingrix - 08/2021, 11/2021  RSV - discussed Advanced directive - would want daughter Cristy to be HCPOA. Daughter knows what he wants. Doesn't want prolonged life support. Not interested in setting up. has previously declined packet.  Seat belt use discussed  Sunscreen use discussed. No changing moles on skin.  Sleep - averaging 9 hours/night  Ex smoker - quit 2019. 50+ yrs smoking.  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 Interim medical history since our last visit reviewed. Allergies and medications reviewed and updated. Outpatient Medications Prior to Visit  Medication Sig Dispense Refill   aspirin  EC 81 MG tablet Take 1 tablet (81 mg total) by mouth daily at 6 (six) AM. Swallow whole. 30 tablet 12   clopidogrel  (PLAVIX ) 75 MG tablet  Take 1 tablet (75 mg total) by mouth daily at 6 (six) AM. 30 tablet 11   diphenhydrAMINE (ALLERGY RELIEF) 25 MG tablet Take 25 mg by mouth daily. In am     meloxicam (MOBIC) 15 MG tablet Take 15 mg by mouth. Daily with food     Multiple Vitamins-Minerals (MULTIVITAMIN ADULTS 50+ PO) Take 1 tablet by mouth daily.     amLODipine  (NORVASC ) 5 MG tablet Take 1 tablet (5 mg total) by mouth daily. 90 tablet 4   atorvastatin  (LIPITOR) 40 MG tablet Take 1 tablet (40 mg total) by mouth daily. 90 tablet 4   lisinopril  (ZESTRIL ) 10 MG tablet Take 1 tablet (10 mg total) by mouth daily. 90 tablet 3   No facility-administered medications prior to visit.     Per HPI unless specifically  indicated in ROS section below Review of Systems  Constitutional:  Negative for activity change, appetite change, chills, fatigue, fever and unexpected weight change.  HENT:  Negative for hearing loss.   Eyes:  Negative for visual disturbance.  Respiratory:  Negative for cough, chest tightness, shortness of breath and wheezing.   Cardiovascular:  Negative for chest pain, palpitations and leg swelling.  Gastrointestinal:  Negative for abdominal distention, abdominal pain, blood in stool, constipation, diarrhea, nausea and vomiting.  Genitourinary:  Negative for difficulty urinating and hematuria.  Musculoskeletal:  Negative for arthralgias, myalgias and neck pain.  Skin:  Negative for rash.  Neurological:  Negative for dizziness, seizures, syncope and headaches.  Hematological:  Negative for adenopathy. Does not bruise/bleed easily.  Psychiatric/Behavioral:  Negative for dysphoric mood. The patient is not nervous/anxious.     Objective:  BP 132/78   Pulse 82   Temp 97.7 F (36.5 C) (Oral)   Ht 5' 7 (1.702 m)   Wt 196 lb (88.9 kg)   SpO2 99%   BMI 30.70 kg/m   Wt Readings from Last 3 Encounters:  03/14/24 196 lb (88.9 kg)  02/19/24 195 lb (88.5 kg)  12/18/23 190 lb (86.2 kg)      Physical Exam Vitals and nursing note reviewed.  Constitutional:      General: He is not in acute distress.    Appearance: Normal appearance. He is well-developed. He is not ill-appearing.  HENT:     Head: Normocephalic and atraumatic.     Right Ear: Hearing, tympanic membrane, ear canal and external ear normal.     Left Ear: Hearing, tympanic membrane, ear canal and external ear normal.     Nose: Nose normal.     Mouth/Throat:     Mouth: Mucous membranes are moist.     Pharynx: Oropharynx is clear. No oropharyngeal exudate or posterior oropharyngeal erythema.  Eyes:     General: No scleral icterus.    Extraocular Movements: Extraocular movements intact.     Conjunctiva/sclera: Conjunctivae  normal.     Pupils: Pupils are equal, round, and reactive to light.  Neck:     Thyroid : No thyroid  mass or thyromegaly.  Cardiovascular:     Rate and Rhythm: Normal rate and regular rhythm.     Pulses: Normal pulses.          Radial pulses are 2+ on the right side and 2+ on the left side.     Heart sounds: Normal heart sounds. No murmur heard. Pulmonary:     Effort: Pulmonary effort is normal. No respiratory distress.     Breath sounds: Normal breath sounds. No wheezing, rhonchi or rales.  Abdominal:  General: Bowel sounds are normal. There is no distension.     Palpations: Abdomen is soft. There is no mass.     Tenderness: There is no abdominal tenderness. There is no guarding or rebound.     Hernia: No hernia is present.  Musculoskeletal:        General: Normal range of motion.     Cervical back: Normal range of motion and neck supple.     Right lower leg: No edema.     Left lower leg: No edema.  Lymphadenopathy:     Cervical: No cervical adenopathy.  Skin:    General: Skin is warm and dry.     Findings: No rash.  Neurological:     General: No focal deficit present.     Mental Status: He is alert and oriented to person, place, and time.  Psychiatric:        Mood and Affect: Mood normal.        Behavior: Behavior normal.        Thought Content: Thought content normal.        Judgment: Judgment normal.       Results for orders placed or performed in visit on 03/07/24  VITAMIN D  25 Hydroxy (Vit-D Deficiency, Fractures)   Collection Time: 03/07/24  7:33 AM  Result Value Ref Range   VITD 28.64 (L) 30.00 - 100.00 ng/mL  Hemoglobin A1c   Collection Time: 03/07/24  7:33 AM  Result Value Ref Range   Hgb A1c MFr Bld 6.1 4.6 - 6.5 %  Comprehensive metabolic panel with GFR   Collection Time: 03/07/24  7:33 AM  Result Value Ref Range   Sodium 141 135 - 145 mEq/L   Potassium 4.4 3.5 - 5.1 mEq/L   Chloride 105 96 - 112 mEq/L   CO2 30 19 - 32 mEq/L   Glucose, Bld 100 (H) 70  - 99 mg/dL   BUN 14 6 - 23 mg/dL   Creatinine, Ser 8.91 0.40 - 1.50 mg/dL   Total Bilirubin 0.6 0.2 - 1.2 mg/dL   Alkaline Phosphatase 67 39 - 117 U/L   AST 15 0 - 37 U/L   ALT 17 0 - 53 U/L   Total Protein 6.6 6.0 - 8.3 g/dL   Albumin 4.1 3.5 - 5.2 g/dL   GFR 34.40 >39.99 mL/min   Calcium  9.4 8.4 - 10.5 mg/dL  Lipid panel   Collection Time: 03/07/24  7:33 AM  Result Value Ref Range   Cholesterol 139 0 - 200 mg/dL   Triglycerides 848.9 (H) 0.0 - 149.0 mg/dL   HDL 61.29 (L) >60.99 mg/dL   VLDL 69.7 0.0 - 59.9 mg/dL   LDL Cholesterol 70 0 - 99 mg/dL   Total CHOL/HDL Ratio 4    NonHDL 100.49    Lab Results  Component Value Date   PSA 1.52 03/06/2023   PSA 0.92 02/20/2022   PSA 0.80 08/09/2021   Assessment & Plan:   Problem List Items Addressed This Visit     Health maintenance examination - Primary (Chronic)   Preventative protocols reviewed and updated unless pt declined. Discussed healthy diet and lifestyle.       Primary hypertension   Chronic, stable. Continue current regimen.       Relevant Medications   amLODipine  (NORVASC ) 5 MG tablet   atorvastatin  (LIPITOR) 40 MG tablet   lisinopril  (ZESTRIL ) 10 MG tablet   Dyslipidemia   Chronic, improved control from last year - continue atorvastatin  40mg  daily.  The 10-year ASCVD risk score (Arnett DK, et al., 2019) is: 34.6%   Values used to calculate the score:     Age: 39 years     Clincally relevant sex: Male     Is Non-Hispanic African American: No     Diabetic: No     Tobacco smoker: No     Systolic Blood Pressure: 132 mmHg     Is BP treated: Yes     HDL Cholesterol: 38.7 mg/dL     Total Cholesterol: 139 mg/dL       Relevant Medications   atorvastatin  (LIPITOR) 40 MG tablet   AAA (abdominal aortic aneurysm) (HCC)   S/p EVAR. Continue regular [facility] f/u. Currently on aspirin  + plavix . Continue statin and tight BP control.       Relevant Medications   amLODipine  (NORVASC ) 5 MG tablet   atorvastatin   (LIPITOR) 40 MG tablet   lisinopril  (ZESTRIL ) 10 MG tablet   Prediabetes   Encouraged limiting added sugars in diet      Systolic murmur   Note appreciated today       Status post endovascular aneurysm repair (EVAR)   Continue [facility] f/u      Vitamin D  deficiency   Suggested intermittent OTC vit D3 supplementation        Meds ordered this encounter  Medications   amLODipine  (NORVASC ) 5 MG tablet    Sig: Take 1 tablet (5 mg total) by mouth daily.    Dispense:  90 tablet    Refill:  4   atorvastatin  (LIPITOR) 40 MG tablet    Sig: Take 1 tablet (40 mg total) by mouth daily.    Dispense:  90 tablet    Refill:  4   lisinopril  (ZESTRIL ) 10 MG tablet    Sig: Take 1 tablet (10 mg total) by mouth daily.    Dispense:  90 tablet    Refill:  4    No orders of the defined types were placed in this encounter.   Patient Instructions  You are doing well today  Consider taking vitamin D3 supplement 1000 units 1-2 times a week over the counter. Continue current medicines.  Return as needed or in 1 year for next physical  Follow up plan: Return in about 1 year (around 03/14/2025) for annual exam, prior fasting for blood work.  Anton Blas, MD

## 2024-03-14 NOTE — Assessment & Plan Note (Signed)
 Suggested intermittent OTC vit D3 supplementation

## 2024-03-14 NOTE — Assessment & Plan Note (Signed)
 Preventative protocols reviewed and updated unless pt declined. Discussed healthy diet and lifestyle.

## 2024-03-14 NOTE — Assessment & Plan Note (Signed)
 S/p EVAR. Continue regular [facility] f/u. Currently on aspirin  + plavix . Continue statin and tight BP control.

## 2024-03-14 NOTE — Patient Instructions (Addendum)
 You are doing well today  Consider taking vitamin D3 supplement 1000 units 1-2 times a week over the counter. Continue current medicines.  Return as needed or in 1 year for next physical

## 2024-03-14 NOTE — Assessment & Plan Note (Signed)
 Chronic, improved control from last year - continue atorvastatin  40mg  daily.  The 10-year ASCVD risk score (Arnett DK, et al., 2019) is: 34.6%   Values used to calculate the score:     Age: 78 years     Clincally relevant sex: Male     Is Non-Hispanic African American: No     Diabetic: No     Tobacco smoker: No     Systolic Blood Pressure: 132 mmHg     Is BP treated: Yes     HDL Cholesterol: 38.7 mg/dL     Total Cholesterol: 139 mg/dL

## 2024-03-14 NOTE — Assessment & Plan Note (Signed)
 Chronic, stable. Continue current regimen.

## 2024-03-14 NOTE — Assessment & Plan Note (Signed)
Continue [facility] f/u 

## 2024-08-19 ENCOUNTER — Encounter (INDEPENDENT_AMBULATORY_CARE_PROVIDER_SITE_OTHER): Payer: Self-pay | Admitting: Nurse Practitioner

## 2024-08-19 ENCOUNTER — Ambulatory Visit (INDEPENDENT_AMBULATORY_CARE_PROVIDER_SITE_OTHER): Admitting: Nurse Practitioner

## 2024-08-19 ENCOUNTER — Other Ambulatory Visit (INDEPENDENT_AMBULATORY_CARE_PROVIDER_SITE_OTHER)

## 2024-08-19 VITALS — BP 156/82 | HR 78 | Resp 18 | Ht 67.0 in | Wt 205.4 lb

## 2024-08-19 DIAGNOSIS — I10 Essential (primary) hypertension: Secondary | ICD-10-CM

## 2024-08-19 DIAGNOSIS — I7143 Infrarenal abdominal aortic aneurysm, without rupture: Secondary | ICD-10-CM | POA: Diagnosis not present

## 2024-08-19 DIAGNOSIS — E785 Hyperlipidemia, unspecified: Secondary | ICD-10-CM

## 2024-08-19 NOTE — Progress Notes (Signed)
 "  Subjective:    Patient ID: Gregory Colon, male    DOB: [birth year], 78 y.o.   MRN: [id number] Chief Complaint  Patient presents with   Follow-up    6 month follow up + EVAR     HPI  Discussed the use of AI scribe software for clinical note transcription with the patient, who gave verbal consent to proceed.  History of Present Illness Gregory Colon is a 78 year old male with abdominal aortic aneurysm, status post endovascular repair, presenting for routine six-month follow-up.  He underwent endovascular repair of his abdominal aortic aneurysm on November 21, 2023. Over the past six months, he denies abdominal pain or gastrointestinal symptoms.  He has not experienced claudication or lower extremity pain.    Results Radiology Abdominal aorta imaging (08/19/2024): Stable abdominal aortic aneurysm, 4.8 cm, no endoleak   Review of Systems  Gastrointestinal:  Negative for abdominal distention and abdominal pain.  All other systems reviewed and are negative.      Objective:   Physical Exam Vitals reviewed.  HENT:     Head: Normocephalic.  Cardiovascular:     Rate and Rhythm: Normal rate.  Pulmonary:     Effort: Pulmonary effort is normal.  Skin:    General: Skin is warm and dry.  Neurological:     Mental Status: He is alert and oriented to person, place, and time.  Psychiatric:        Mood and Affect: Mood normal.        Behavior: Behavior normal.        Thought Content: Thought content normal.        Judgment: Judgment normal.     Physical Exam    BP (!) 156/82 (BP Location: Left Arm, Patient Position: Sitting, Cuff Size: Normal)   Pulse 78   Resp 18   Ht 5' 7 (1.702 m)   Wt 205 lb 6.4 oz (93.2 kg)   BMI 32.17 kg/m   Past Medical History:  Diagnosis Date   AAA (abdominal aortic aneurysm) 11/2014   by MRI 3cm   Arthritis    R hand   Benign essential hypertension    Coronary artery calcification seen on CT scan 10/2014   Emphysema of lung  (HCC) 10/2014   by CT   Fatty liver 11/2014   by US    History of chicken pox    History of kidney stones 1966   Hyperlipidemia    Hypertension    Kidney cysts 11/2014   R>L   Liver cyst    multiple   Smoker     Social History   Socioeconomic History   Marital status: Single    Spouse name: Not on file   Number of children: Not on file   Years of education: Not on file   Highest education level: Not on file  Occupational History   Not on file  Tobacco Use   Smoking status: Former    Current packs/day: 0.00    Average packs/day: 0.3 packs/day for 52.3 years (13.1 ttl pk-yrs)    Types: Cigarettes    Start date: 08/28/1964    Quit date: 12/26/2016    Years since quitting: 7.6   Smokeless tobacco: Never   Tobacco comments:    30+ PY  Substance and Sexual Activity   Alcohol use: No    Alcohol/week: 0.0 standard drinks of alcohol   Drug use: No   Sexual activity: Never  Other Topics Concern   Not  on file  Social History Narrative   Lives alone, divorced, father of Gregory Colon   Occupation: retired, was educational psychologist shop   Activity: yardwork - cutting wood   Diet: some water, fruits/vegetables some   Social Drivers of Health   Tobacco Use: Medium Risk (08/19/2024)   Patient History    Smoking Tobacco Use: Former    Smokeless Tobacco Use: Never    Passive Exposure: Not on Actuary Strain: Low Risk (08/15/2023)   Overall Financial Resource Strain (CARDIA)    Difficulty of Paying Living Expenses: Not hard at all  Food Insecurity: No Food Insecurity (11/21/2023)   Hunger Vital Sign    Worried About Running Out of Food in the Last Year: Never true    Ran Out of Food in the Last Year: Never true  Transportation Needs: No Transportation Needs (11/21/2023)   PRAPARE - Administrator, Civil Service (Medical): No    Lack of Transportation (Non-Medical): No  Physical Activity: Inactive (08/15/2023)   Exercise Vital Sign    Days of  Exercise per Week: 0 days    Minutes of Exercise per Session: 0 min  Stress: No Stress Concern Present (08/15/2023)   [employer] of Occupational Health - Occupational Stress Questionnaire    Feeling of Stress : Not at all  Social Connections: Moderately Isolated (11/21/2023)   Social Connection and Isolation Panel    Frequency of Communication with Friends and Family: Three times a week    Frequency of Social Gatherings with Friends and Family: Once a week    Attends Religious Services: 1 to 4 times per year    Active Member of [employer] or Organizations: No    Attends Banker Meetings: Never    Marital Status: Widowed  Intimate Partner Violence: Not At Risk (11/21/2023)   Humiliation, Afraid, Rape, and Kick questionnaire    Fear of Current or Ex-Partner: No    Emotionally Abused: No    Physically Abused: No    Sexually Abused: No  Depression (PHQ2-9): Low Risk (03/14/2024)   Depression (PHQ2-9)    PHQ-2 Score: 1  Alcohol Screen: Low Risk (08/15/2023)   Alcohol Screen    Last Alcohol Screening Score (AUDIT): 0  Housing: Low Risk (11/21/2023)   Housing Stability Vital Sign    Unable to Pay for Housing in the Last Year: No    Number of Times Moved in the Last Year: 0    Homeless in the Last Year: No  Utilities: Not At Risk (11/21/2023)   AHC Utilities    Threatened with loss of utilities: No  Health Literacy: Inadequate Health Literacy (08/15/2023)   B1300 Health Literacy    Frequency of need for help with medical instructions: Sometimes    Past Surgical History:  Procedure Laterality Date   ANKLE SURGERY Bilateral    L then R - fell off lader on separate occasions Ted) remotely   CHOLECYSTECTOMY  2000   COLONOSCOPY N/A 01/08/2015   mult TAs Beverlie ONEIDA Holmes, MD)   COLONOSCOPY N/A 05/11/2016   TAs and SSA, rpt 3 yrs (Dr Gregory ONEIDA Holmes)   COLONOSCOPY WITH PROPOFOL  N/A 06/16/2019   TA x4, no f/u recommended Romero, Ruel, MD)   ENDOVASCULAR STENT GRAFT  (AAA) N/A 11/21/2023   Procedure: ENDOVASCULAR REPAIR/STENT GRAFT;  Surgeon: Marea Selinda RAMAN, MD;  Location: [facility];  Service: Cardiovascular;  Laterality: N/A;   ESOPHAGOGASTRODUODENOSCOPY N/A 01/08/2015   gastritis, HH Beverlie T  Viktoria, MD)    Family History  Problem Relation Age of Onset   Arthritis Mother    Cancer Brother        lung   Hypertension Neg Hx    CAD Neg Hx    Stroke Neg Hx    Diabetes Neg Hx     Allergies[1]     Latest Ref Rng & Units 11/22/2023    5:28 AM 11/21/2023    2:38 PM 11/13/2023    9:24 AM  CBC  WBC 4.0 - 10.5 K/uL 16.2  11.2  8.7   Hemoglobin 13.0 - 17.0 g/dL 86.6  86.3  84.7   Hematocrit 39.0 - 52.0 % 39.1  39.4  43.8   Platelets 150 - 400 K/uL 244  232  268       CMP     Component Value Date/Time   NA 141 03/07/2024 0733   NA 136 08/25/2014 2004   K 4.4 03/07/2024 0733   K 4.3 08/25/2014 2004   CL 105 03/07/2024 0733   CL 105 08/25/2014 2004   CO2 30 03/07/2024 0733   CO2 25 08/25/2014 2004   GLUCOSE 100 (H) 03/07/2024 0733   GLUCOSE 99 08/25/2014 2004   BUN 14 03/07/2024 0733   BUN 17 08/25/2014 2004   CREATININE 1.08 03/07/2024 0733   CREATININE 0.98 08/25/2014 2004   CALCIUM  9.4 03/07/2024 0733   CALCIUM  9.1 08/25/2014 2004   PROT 6.6 03/07/2024 0733   PROT 7.4 08/25/2014 2004   ALBUMIN 4.1 03/07/2024 0733   ALBUMIN 3.9 08/25/2014 2004   AST 15 03/07/2024 0733   AST 17 08/25/2014 2004   ALT 17 03/07/2024 0733   ALT 25 08/25/2014 2004   ALKPHOS 67 03/07/2024 0733   ALKPHOS 76 08/25/2014 2004   BILITOT 0.6 03/07/2024 0733   BILITOT 0.2 08/25/2014 2004   GFR 65.59 03/07/2024 0733   GFRNONAA >60 11/22/2023 0528   GFRNONAA >60 08/25/2014 2004     No results found.     Assessment & Plan:   1. Infrarenal abdominal aortic aneurysm (AAA) without rupture (Primary) Abdominal aortic aneurysm, post endovascular repair Status post endovascular repair with stable aneurysm size at 4.8 cm, no endoleak, asymptomatic.  Ongoing surveillance required due to risk of late complications. - Transitioned to annual follow-up due to stable size and absence of endoleak. - Emphasized importance of ongoing surveillance.  2. Primary hypertension Continue antihypertensive medications as already ordered, these medications have been reviewed and there are no changes at this time.  3. Dyslipidemia Continue statin as ordered and reviewed, no changes at this time   Assessment and Plan Assessment & Plan      Current Outpatient Medications on File Prior to Visit  Medication Sig Dispense Refill   amLODipine  (NORVASC ) 5 MG tablet Take 1 tablet (5 mg total) by mouth daily. 90 tablet 4   aspirin  EC 81 MG tablet Take 1 tablet (81 mg total) by mouth daily at 6 (six) AM. Swallow whole. 30 tablet 12   atorvastatin  (LIPITOR) 40 MG tablet Take 1 tablet (40 mg total) by mouth daily. 90 tablet 4   clopidogrel  (PLAVIX ) 75 MG tablet Take 1 tablet (75 mg total) by mouth daily at 6 (six) AM. 30 tablet 11   diphenhydrAMINE (ALLERGY RELIEF) 25 MG tablet Take 25 mg by mouth daily. In am     lisinopril  (ZESTRIL ) 10 MG tablet Take 1 tablet (10 mg total) by mouth daily. 90 tablet 4   meloxicam (MOBIC) 15  MG tablet Take 15 mg by mouth. Daily with food     Multiple Vitamins-Minerals (MULTIVITAMIN ADULTS 50+ PO) Take 1 tablet by mouth daily.     No current facility-administered medications on file prior to visit.    There are no Patient Instructions on file for this visit. No follow-ups on file.   Jaiya Mooradian E Verna Hamon, NP      [1] No Known Allergies  "

## 2025-03-09 ENCOUNTER — Ambulatory Visit

## 2025-03-09 ENCOUNTER — Other Ambulatory Visit

## 2025-03-16 ENCOUNTER — Encounter: Admitting: Family Medicine

## 2025-08-18 ENCOUNTER — Other Ambulatory Visit (INDEPENDENT_AMBULATORY_CARE_PROVIDER_SITE_OTHER)

## 2025-08-18 ENCOUNTER — Ambulatory Visit (INDEPENDENT_AMBULATORY_CARE_PROVIDER_SITE_OTHER): Admitting: Vascular Surgery
